# Patient Record
Sex: Female | Born: 1950
Health system: Southern US, Community
[De-identification: ages and names within clinical notes are randomized; demographics above are authoritative.]

## PROBLEM LIST (undated history)

## (undated) DIAGNOSIS — E119 Type 2 diabetes mellitus without complications: Secondary | ICD-10-CM

## (undated) DIAGNOSIS — E7800 Pure hypercholesterolemia, unspecified: Secondary | ICD-10-CM

## (undated) DIAGNOSIS — J209 Acute bronchitis, unspecified: Secondary | ICD-10-CM

## (undated) DIAGNOSIS — E663 Overweight: Secondary | ICD-10-CM

## (undated) DIAGNOSIS — M1990 Unspecified osteoarthritis, unspecified site: Secondary | ICD-10-CM

## (undated) DIAGNOSIS — T7840XA Allergy, unspecified, initial encounter: Secondary | ICD-10-CM

## (undated) DIAGNOSIS — K760 Fatty (change of) liver, not elsewhere classified: Secondary | ICD-10-CM

## (undated) DIAGNOSIS — M545 Low back pain, unspecified: Secondary | ICD-10-CM

## (undated) DIAGNOSIS — F329 Major depressive disorder, single episode, unspecified: Secondary | ICD-10-CM

## (undated) DIAGNOSIS — F32A Depression, unspecified: Secondary | ICD-10-CM

## (undated) DIAGNOSIS — I10 Essential (primary) hypertension: Secondary | ICD-10-CM

## (undated) DIAGNOSIS — R040 Epistaxis: Secondary | ICD-10-CM

## (undated) HISTORY — DX: Type 2 diabetes mellitus without complications: E11.9

## (undated) HISTORY — DX: Acute bronchitis, unspecified: J20.9

## (undated) HISTORY — DX: Pure hypercholesterolemia, unspecified: E78.00

## (undated) HISTORY — DX: Overweight: E66.3

## (undated) HISTORY — DX: Major depressive disorder, single episode, unspecified: F32.9

## (undated) HISTORY — DX: Fatty (change of) liver, not elsewhere classified: K76.0

## (undated) HISTORY — DX: Depression, unspecified: F32.A

## (undated) HISTORY — DX: Low back pain, unspecified: M54.50

## (undated) HISTORY — DX: Low back pain: M54.5

## (undated) HISTORY — DX: Unspecified osteoarthritis, unspecified site: M19.90

## (undated) HISTORY — DX: Epistaxis: R04.0

## (undated) HISTORY — DX: Essential (primary) hypertension: I10

## (undated) HISTORY — DX: Allergy, unspecified, initial encounter: T78.40XA

---

## 1998-12-28 HISTORY — PX: VESICOVAGINAL FISTULA CLOSURE W/ TAH: SUR271

## 1999-12-29 HISTORY — PX: INCISIONAL HERNIA REPAIR: SHX193

## 2004-11-26 ENCOUNTER — Ambulatory Visit: Payer: Self-pay | Admitting: Pulmonary Disease

## 2004-11-27 ENCOUNTER — Ambulatory Visit: Payer: Self-pay | Admitting: Pulmonary Disease

## 2004-12-04 ENCOUNTER — Ambulatory Visit: Payer: Self-pay | Admitting: Gastroenterology

## 2006-01-18 ENCOUNTER — Ambulatory Visit: Payer: Self-pay | Admitting: Pulmonary Disease

## 2006-02-23 ENCOUNTER — Ambulatory Visit: Payer: Self-pay | Admitting: Pulmonary Disease

## 2006-03-08 ENCOUNTER — Ambulatory Visit: Payer: Self-pay | Admitting: Pulmonary Disease

## 2007-02-01 ENCOUNTER — Ambulatory Visit: Payer: Self-pay | Admitting: Pulmonary Disease

## 2008-05-01 DIAGNOSIS — M545 Low back pain: Secondary | ICD-10-CM

## 2008-05-01 DIAGNOSIS — T7840XA Allergy, unspecified, initial encounter: Secondary | ICD-10-CM | POA: Insufficient documentation

## 2008-05-01 DIAGNOSIS — J45909 Unspecified asthma, uncomplicated: Secondary | ICD-10-CM | POA: Insufficient documentation

## 2008-05-01 DIAGNOSIS — I10 Essential (primary) hypertension: Secondary | ICD-10-CM | POA: Insufficient documentation

## 2008-05-02 ENCOUNTER — Ambulatory Visit: Payer: Self-pay | Admitting: Pulmonary Disease

## 2008-05-02 DIAGNOSIS — E663 Overweight: Secondary | ICD-10-CM | POA: Insufficient documentation

## 2008-05-02 DIAGNOSIS — K7689 Other specified diseases of liver: Secondary | ICD-10-CM | POA: Insufficient documentation

## 2008-05-02 DIAGNOSIS — K8020 Calculus of gallbladder without cholecystitis without obstruction: Secondary | ICD-10-CM | POA: Insufficient documentation

## 2008-05-02 DIAGNOSIS — E7800 Pure hypercholesterolemia, unspecified: Secondary | ICD-10-CM

## 2009-06-06 ENCOUNTER — Ambulatory Visit: Payer: Self-pay | Admitting: Diagnostic Radiology

## 2009-06-06 ENCOUNTER — Ambulatory Visit (HOSPITAL_BASED_OUTPATIENT_CLINIC_OR_DEPARTMENT_OTHER): Admission: RE | Admit: 2009-06-06 | Discharge: 2009-06-06 | Payer: Self-pay | Admitting: Pulmonary Disease

## 2009-06-06 ENCOUNTER — Ambulatory Visit: Payer: Self-pay | Admitting: Pulmonary Disease

## 2009-12-05 ENCOUNTER — Ambulatory Visit: Payer: Self-pay | Admitting: Pulmonary Disease

## 2010-11-26 ENCOUNTER — Ambulatory Visit: Payer: Self-pay | Admitting: Pulmonary Disease
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
## 2011-06-01 ENCOUNTER — Other Ambulatory Visit: Payer: Self-pay | Admitting: *Deleted

## 2011-06-01 MED ORDER — HYDROCOD POLST-CHLORPHEN POLST 10-8 MG/5ML PO LQCR: 5.0000 mL | Freq: Two times a day (BID) | ORAL | Status: DC

## 2011-06-01 NOTE — Telephone Encounter (Signed)
Refill of tussionex faxed back to the pharamcy.  Pt will need ov for further refills.

## 2011-06-11 ENCOUNTER — Other Ambulatory Visit: Payer: Self-pay | Admitting: Pulmonary Disease

## 2011-10-19 ENCOUNTER — Encounter: Payer: Self-pay | Admitting: Pulmonary Disease

## 2011-10-20 ENCOUNTER — Encounter: Payer: Self-pay | Admitting: Pulmonary Disease

## 2011-10-20 ENCOUNTER — Telehealth: Payer: Self-pay | Admitting: Pulmonary Disease

## 2011-10-20 ENCOUNTER — Ambulatory Visit (INDEPENDENT_AMBULATORY_CARE_PROVIDER_SITE_OTHER): Payer: Managed Care, Other (non HMO) | Admitting: Pulmonary Disease

## 2011-10-20 VITALS — BP 148/80 | HR 92 | Temp 98.1°F | Ht 62.0 in | Wt 211.8 lb

## 2011-10-20 DIAGNOSIS — Z0000 Encounter for general adult medical examination without abnormal findings: Secondary | ICD-10-CM

## 2011-10-20 DIAGNOSIS — M1990 Unspecified osteoarthritis, unspecified site: Secondary | ICD-10-CM | POA: Insufficient documentation

## 2011-10-20 DIAGNOSIS — I10 Essential (primary) hypertension: Secondary | ICD-10-CM

## 2011-10-20 DIAGNOSIS — E663 Overweight: Secondary | ICD-10-CM

## 2011-10-20 DIAGNOSIS — M2550 Pain in unspecified joint: Secondary | ICD-10-CM

## 2011-10-20 DIAGNOSIS — E7800 Pure hypercholesterolemia, unspecified: Secondary | ICD-10-CM

## 2011-10-20 DIAGNOSIS — M545 Low back pain: Secondary | ICD-10-CM

## 2011-10-20 MED ORDER — HYDROCOD POLST-CHLORPHEN POLST 10-8 MG/5ML PO LQCR: 5.0000 mL | Freq: Two times a day (BID) | ORAL | Status: DC

## 2011-10-20 MED ORDER — VALSARTAN-HYDROCHLOROTHIAZIDE 160-12.5 MG PO TABS: 1.0000 | ORAL_TABLET | Freq: Every day | ORAL | Status: DC

## 2011-10-20 MED ORDER — POTASSIUM CHLORIDE 10 MEQ PO CPCR: 20.0000 meq | ORAL_CAPSULE | Freq: Every day | ORAL | Status: DC

## 2011-10-20 MED ORDER — AMLODIPINE BESYLATE 5 MG PO TABS: 5.0000 mg | ORAL_TABLET | Freq: Every day | ORAL | Status: DC

## 2011-10-20 MED ORDER — FLUTICASONE-SALMETEROL 250-50 MCG/DOSE IN AEPB: 1.0000 | INHALATION_SPRAY | Freq: Two times a day (BID) | RESPIRATORY_TRACT | Status: DC
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
## 2011-10-20 NOTE — Patient Instructions (Signed)
Today we updated your med list in our EPIC system...    We refilled your meds per request...  Please return to our lab in the AM for your follow up fasting blood work...    Please call the [facility] in a few days for your results...    Dial [id number] & when prompted enter your patient number followed by the # symbol...    Your patient number is:  [id number]#  Let's get on track w/ our diet & exercise program...    The goal is to lose 10-15 lbs...  We will inquire about an excellent Orthopedist in [facility] for you to see...  Call for any questions.Marland KitchenMarland Kitchen

## 2011-10-20 NOTE — Telephone Encounter (Signed)
Called and spoke with pt---she stated that she will need a letter stating that we will be doing her chol screening.  We will not be able to put any numbers into this letter since it is being sent to her employer.  She will drop this off in the morning along with the fax number.

## 2011-10-21 ENCOUNTER — Other Ambulatory Visit (INDEPENDENT_AMBULATORY_CARE_PROVIDER_SITE_OTHER): Payer: Managed Care, Other (non HMO)

## 2011-10-21 ENCOUNTER — Encounter: Payer: Self-pay | Admitting: *Deleted

## 2011-10-21 DIAGNOSIS — M1990 Unspecified osteoarthritis, unspecified site: Secondary | ICD-10-CM

## 2011-10-21 DIAGNOSIS — Z0000 Encounter for general adult medical examination without abnormal findings: Secondary | ICD-10-CM

## 2011-10-21 LAB — CBC WITH DIFFERENTIAL/PLATELET
Basophils Absolute: 0 10*3/uL (ref 0.0–0.1)
Eosinophils Relative: 2.5 % (ref 0.0–5.0)
Lymphocytes Relative: 31.1 % (ref 12.0–46.0)
Lymphs Abs: 3 10*3/uL (ref 0.7–4.0)
Monocytes Relative: 5.7 % (ref 3.0–12.0)
Neutrophils Relative %: 60.3 % (ref 43.0–77.0)
Platelets: 246 10*3/uL (ref 150.0–400.0)
RDW: 13.9 % (ref 11.5–14.6)
WBC: 9.5 10*3/uL (ref 4.5–10.5)

## 2011-10-21 LAB — BASIC METABOLIC PANEL WITH GFR
BUN: 17 mg/dL (ref 6–23)
CO2: 26 meq/L (ref 19–32)
Calcium: 9.2 mg/dL (ref 8.4–10.5)
Chloride: 106 meq/L (ref 96–112)
Creatinine, Ser: 0.8 mg/dL (ref 0.4–1.2)
GFR: 82.62 mL/min
Glucose, Bld: 108 mg/dL — ABNORMAL HIGH (ref 70–99)
Potassium: 3.5 meq/L (ref 3.5–5.1)
Sodium: 142 meq/L (ref 135–145)

## 2011-10-21 LAB — LIPID PANEL
HDL: 52.5 mg/dL (ref >39.00)
Triglycerides: 92 mg/dL (ref 0.0–149.0)

## 2011-10-21 LAB — HEPATIC FUNCTION PANEL
ALT: 36 U/L — ABNORMAL HIGH (ref 0–35)
AST: 27 U/L (ref 0–37)
Albumin: 4.2 g/dL (ref 3.5–5.2)
Alkaline Phosphatase: 53 U/L (ref 39–117)
Total Protein: 8.1 g/dL (ref 6.0–8.3)

## 2011-10-21 LAB — TSH: TSH: 2.94 u[IU]/mL (ref 0.35–5.50)

## 2011-10-22 LAB — RHEUMATOID FACTOR: Rhuematoid fact SerPl-aCnc: <10 IU/mL (ref <=14)

## 2011-10-22 LAB — ANA: Anti Nuclear Antibody(ANA): NEGATIVE

## 2011-10-27 ENCOUNTER — Encounter: Payer: Self-pay | Admitting: Pulmonary Disease

## 2011-10-27 NOTE — Telephone Encounter (Signed)
lmom advising letter has been faxed and labs and other forms have been placed in the mail back to the pt.

## 2011-10-27 NOTE — Telephone Encounter (Signed)
Letter has been done and faxed to [employer] at [phone].  Labs and other forms have been placed in the mail back to the patient.

## 2011-10-27 NOTE — Telephone Encounter (Signed)
Pt is checking on status of letter.  Pt can be reached before 5:00 at [phone] and after 5:00 at [phone].  Anne Christensen

## 2012-02-08 ENCOUNTER — Telehealth: Payer: Self-pay | Admitting: Pulmonary Disease

## 2012-02-08 NOTE — Telephone Encounter (Signed)
Called and spoke with pt.  Pt states she just recently started the Valsartan- HCTZ a few weeks ago (states she wanted to finish up her old med first)  Pt states since starting on Valsartan, she has been having "awful headaches" every afternoon and "feels hot" often.  Pt states she hasn't had her BP checked recently.  Pt states she did [facility] and was told she needs stronger reading glasses and pt states today she noticed her vision is a little blurry.  Pt is requesting SN's recs.  Please advise.  Thanks! Allergies  Allergen Reactions  . Penicillins     Swelling, rash, hives all over

## 2012-02-09 NOTE — Telephone Encounter (Signed)
Per SN----this must have been when insurance changed to generic diovan/hct.     We can either  1.  Change back to diovan/hct  160/12.5 daily 2. Change to losartan/hct  100/12.5 daily--this is a different med.    lmomtcb for pt to find out which med she wants to go back on.

## 2012-02-09 NOTE — Telephone Encounter (Signed)
Pt calling again in ref to previous message can be reached at [phone].Anne Christensen

## 2012-02-10 MED ORDER — DIOVAN HCT 160-12.5 MG PO TABS: 1.0000 | ORAL_TABLET | Freq: Every day | ORAL | Status: DC

## 2012-02-10 NOTE — Telephone Encounter (Signed)
I spoke with pt and is requesting anrx for DAW rx for her diovan hct sent to the pharmacy since she feels like that helps her. I have sent this rx and nothing further was needed

## 2012-05-24 ENCOUNTER — Telehealth: Payer: Self-pay | Admitting: Pulmonary Disease

## 2012-05-24 MED ORDER — LOSARTAN POTASSIUM-HCTZ 100-12.5 MG PO TABS: 1.0000 | ORAL_TABLET | Freq: Every day | ORAL | Status: DC

## 2012-05-24 NOTE — Telephone Encounter (Signed)
Called, spoke with pt who states diovan hct will now cost $237.  This has increased from the last time she had this filled.  She has spoken with her employer in HR regarding this and was advised it was probably because there is a generic for diovan hct now.  However, pt has tried valsartan hctz in the past and experienced HA while taking it.  Dr. Kriste Basque, pls advise.  Thank you.  Note:  I did call [facility] on [address] and spoke with Kathlene November who did advise no PA is available for the diovan hct.

## 2012-05-24 NOTE — Telephone Encounter (Signed)
Per SN---ok to change to losartan /hctz  100.12.5  1 daily.  Called and spoke with pt and she is aware that this med has been sent to her pharmacy for 90 day supply.  Nothing further needed.

## 2012-09-05 ENCOUNTER — Other Ambulatory Visit: Payer: Self-pay | Admitting: Pulmonary Disease

## 2012-10-17 ENCOUNTER — Ambulatory Visit: Payer: Managed Care, Other (non HMO) | Admitting: Pulmonary Disease

## 2012-10-25 ENCOUNTER — Encounter: Payer: Self-pay | Admitting: *Deleted

## 2012-10-26 ENCOUNTER — Ambulatory Visit (INDEPENDENT_AMBULATORY_CARE_PROVIDER_SITE_OTHER): Payer: Managed Care, Other (non HMO) | Admitting: Pulmonary Disease

## 2012-10-26 ENCOUNTER — Encounter: Payer: Self-pay | Admitting: Pulmonary Disease

## 2012-10-26 VITALS — BP 138/82 | HR 116 | Temp 98.4°F | Ht 62.0 in | Wt 218.0 lb

## 2012-10-26 DIAGNOSIS — M1990 Unspecified osteoarthritis, unspecified site: Secondary | ICD-10-CM

## 2012-10-26 DIAGNOSIS — K7689 Other specified diseases of liver: Secondary | ICD-10-CM

## 2012-10-26 DIAGNOSIS — E663 Overweight: Secondary | ICD-10-CM

## 2012-10-26 DIAGNOSIS — I10 Essential (primary) hypertension: Secondary | ICD-10-CM

## 2012-10-26 DIAGNOSIS — M545 Low back pain: Secondary | ICD-10-CM

## 2012-10-26 DIAGNOSIS — K8020 Calculus of gallbladder without cholecystitis without obstruction: Secondary | ICD-10-CM

## 2012-10-26 DIAGNOSIS — E7800 Pure hypercholesterolemia, unspecified: Secondary | ICD-10-CM

## 2012-10-26 MED ORDER — AMLODIPINE BESYLATE 5 MG PO TABS: 5.0000 mg | ORAL_TABLET | Freq: Every day | ORAL | Status: DC

## 2012-10-26 MED ORDER — LOSARTAN POTASSIUM-HCTZ 100-12.5 MG PO TABS: 1.0000 | ORAL_TABLET | Freq: Every day | ORAL | Status: DC

## 2012-10-26 MED ORDER — POTASSIUM CHLORIDE ER 10 MEQ PO CPCR: ORAL_CAPSULE | ORAL | Status: DC
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
## 2012-10-26 NOTE — Patient Instructions (Addendum)
Today we updated your med list in our EPIC system...    Continue your current medications the same...    We refilled your meds per request...  For your sinuses>>    Use a non-medicated, plain SALINE nasal mist & spray your nose every 1-2 H as needed...    If you have recurrent epistaxis - we should refer you to an ENT specialist for further Rx...  REMEMBER:  You need to get a baseline screening colonoscopy!!!  Please set this up at your convenience w/ DrStark in our GI Dept or my nurse would be happy to assist you w/ these arrangements...  Let's work on weight reduction by getting on a low carb low fat diet & increasing our exercise program...  Call for any questions or concerns...  Let's continue our yearly follow up visits but call anytime as needed for any problems.Marland KitchenMarland Kitchen

## 2012-12-05 ENCOUNTER — Other Ambulatory Visit: Payer: Self-pay | Admitting: Pulmonary Disease

## 2013-05-02 ENCOUNTER — Other Ambulatory Visit: Payer: Self-pay | Admitting: Pulmonary Disease

## 2013-07-18 ENCOUNTER — Ambulatory Visit (INDEPENDENT_AMBULATORY_CARE_PROVIDER_SITE_OTHER): Payer: BC Managed Care – PPO | Admitting: Pulmonary Disease

## 2013-07-18 ENCOUNTER — Other Ambulatory Visit (INDEPENDENT_AMBULATORY_CARE_PROVIDER_SITE_OTHER): Payer: BC Managed Care – PPO

## 2013-07-18 ENCOUNTER — Encounter: Payer: Self-pay | Admitting: Pulmonary Disease

## 2013-07-18 ENCOUNTER — Ambulatory Visit (INDEPENDENT_AMBULATORY_CARE_PROVIDER_SITE_OTHER)
Admission: RE | Admit: 2013-07-18 | Discharge: 2013-07-18 | Disposition: A | Discharge status: Home / Self Care | Payer: BC Managed Care – PPO | Source: Ambulatory Visit | Attending: Pulmonary Disease | Admitting: Pulmonary Disease

## 2013-07-18 VITALS — BP 136/84 | HR 100 | Temp 99.2°F | Ht 62.0 in | Wt 220.0 lb

## 2013-07-18 DIAGNOSIS — Z0000 Encounter for general adult medical examination without abnormal findings: Secondary | ICD-10-CM

## 2013-07-18 DIAGNOSIS — I10 Essential (primary) hypertension: Secondary | ICD-10-CM

## 2013-07-18 DIAGNOSIS — R040 Epistaxis: Secondary | ICD-10-CM

## 2013-07-18 DIAGNOSIS — K7689 Other specified diseases of liver: Secondary | ICD-10-CM

## 2013-07-18 DIAGNOSIS — E7800 Pure hypercholesterolemia, unspecified: Secondary | ICD-10-CM

## 2013-07-18 DIAGNOSIS — M545 Low back pain: Secondary | ICD-10-CM

## 2013-07-18 DIAGNOSIS — J45909 Unspecified asthma, uncomplicated: Secondary | ICD-10-CM

## 2013-07-18 DIAGNOSIS — M1990 Unspecified osteoarthritis, unspecified site: Secondary | ICD-10-CM

## 2013-07-18 DIAGNOSIS — E663 Overweight: Secondary | ICD-10-CM

## 2013-07-18 LAB — CBC WITH DIFFERENTIAL/PLATELET
Basophils Absolute: 0 10*3/uL (ref 0.0–0.1)
Eosinophils Absolute: 0.2 10*3/uL (ref 0.0–0.7)
Hemoglobin: 15.3 g/dL — ABNORMAL HIGH (ref 12.0–15.0)
Lymphocytes Relative: 25 % (ref 12.0–46.0)
MCHC: 33.9 g/dL (ref 30.0–36.0)
Monocytes Absolute: 0.6 10*3/uL (ref 0.1–1.0)
Neutro Abs: 8.9 10*3/uL — ABNORMAL HIGH (ref 1.4–7.7)
Neutrophils Relative %: 68.5 % (ref 43.0–77.0)
RDW: 13.8 % (ref 11.5–14.6)

## 2013-07-18 LAB — BASIC METABOLIC PANEL
CO2: 26 mEq/L (ref 19–32)
Chloride: 102 mEq/L (ref 96–112)
Sodium: 139 mEq/L (ref 135–145)

## 2013-07-18 LAB — LDL CHOLESTEROL, DIRECT: Direct LDL: 149 mg/dL

## 2013-07-18 LAB — HEPATIC FUNCTION PANEL
ALT: 67 U/L — ABNORMAL HIGH (ref 0–35)
Alkaline Phosphatase: 55 U/L (ref 39–117)
Bilirubin, Direct: 0.1 mg/dL (ref 0.0–0.3)
Total Protein: 8.3 g/dL (ref 6.0–8.3)

## 2013-07-18 LAB — LIPID PANEL: Total CHOL/HDL Ratio: 5

## 2013-07-18 MED ORDER — LOSARTAN POTASSIUM-HCTZ 100-12.5 MG PO TABS: 1.0000 | ORAL_TABLET | Freq: Every day | ORAL | Status: DC

## 2013-07-18 MED ORDER — AMLODIPINE BESYLATE 5 MG PO TABS: 5.0000 mg | ORAL_TABLET | Freq: Every day | ORAL | Status: DC

## 2013-07-18 MED ORDER — POTASSIUM CHLORIDE ER 10 MEQ PO CPCR: ORAL_CAPSULE | ORAL | Status: DC

## 2013-07-18 MED ORDER — HYDROCOD POLST-CHLORPHEN POLST 10-8 MG/5ML PO LQCR: 5.0000 mL | Freq: Two times a day (BID) | ORAL | Status: DC

## 2013-07-18 NOTE — Patient Instructions (Addendum)
Today we updated your med list in our EPIC system...    Continue your current medications the same...    We refilled your meds per request...  Today we did your follow up CXR & FASTING blood work...    We will contact you w/ the results when available...     We will complete your work HealthForm & fax it to [employer]...  Let's get on track w/ our diet & exercise program...  Call for any questions...  Let's plan a follow up visit in 1yr, sooner if needed for problems.Marland KitchenMarland Kitchen
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
## 2013-07-20 ENCOUNTER — Other Ambulatory Visit: Payer: Self-pay | Admitting: Pulmonary Disease

## 2013-07-20 DIAGNOSIS — E7800 Pure hypercholesterolemia, unspecified: Secondary | ICD-10-CM

## 2013-07-20 MED ORDER — POTASSIUM CHLORIDE ER 10 MEQ PO CPCR: ORAL_CAPSULE | ORAL | Status: DC

## 2013-07-20 MED ORDER — SIMVASTATIN 20 MG PO TABS: 20.0000 mg | ORAL_TABLET | Freq: Every evening | ORAL | Status: DC

## 2013-07-25 ENCOUNTER — Telehealth: Payer: Self-pay | Admitting: Pulmonary Disease

## 2013-07-25 NOTE — Telephone Encounter (Signed)
Leigh did you mail an Rx to this pt for potassium? And please advise if paperwork has been completed for her employer? Was this sent to [employer]? Please advise. Carron Curie, CMA

## 2013-07-26 MED ORDER — POTASSIUM CHLORIDE ER 10 MEQ PO CPCR: ORAL_CAPSULE | ORAL | Status: DC

## 2013-07-26 NOTE — Telephone Encounter (Signed)
Patient calling back about the same.  Work # [phone]

## 2013-07-26 NOTE — Telephone Encounter (Signed)
rx has been printed out again and will place in the mail to the pt again.  Form has been filled out by SN and i have called and spoke with pt and she is aware that this has been faxed back to [employer].

## 2013-07-28 ENCOUNTER — Telehealth: Payer: Self-pay | Admitting: Pulmonary Disease

## 2013-07-28 NOTE — Telephone Encounter (Signed)
Called and spoke with pt and she is aware that this form has been faxed to her and to the company.  Nothing further is needed.

## 2013-09-26 ENCOUNTER — Encounter: Payer: Self-pay | Admitting: Gastroenterology

## 2013-11-02 ENCOUNTER — Other Ambulatory Visit: Payer: Self-pay

## 2014-03-06 ENCOUNTER — Encounter: Payer: Self-pay | Admitting: Pulmonary Disease

## 2014-06-14 ENCOUNTER — Other Ambulatory Visit: Payer: Self-pay | Admitting: Pulmonary Disease

## 2014-07-17 ENCOUNTER — Telehealth: Payer: Self-pay | Admitting: Pulmonary Disease

## 2014-07-17 MED ORDER — POTASSIUM CHLORIDE ER 10 MEQ PO CPCR: ORAL_CAPSULE | ORAL | Status: DC

## 2014-07-17 NOTE — Telephone Encounter (Signed)
[id number] x1 

## 2014-07-17 NOTE — Telephone Encounter (Signed)
I called spoke with pt. She wanted to know who SN recommended for PCP. I advised her any of the [facility] offices. She did not need a referral. She will call for her own appt per pt. She did need new RX [facility] potassium [facility]. i have done so. Nothing further needed

## 2014-09-14 ENCOUNTER — Other Ambulatory Visit: Payer: Self-pay | Admitting: Pulmonary Disease

## 2014-10-05 ENCOUNTER — Ambulatory Visit: Payer: BC Managed Care – PPO | Admitting: Physician Assistant

## 2016-03-13 ENCOUNTER — Telehealth: Payer: Self-pay | Admitting: *Deleted

## 2016-03-13 NOTE — Telephone Encounter (Signed)
Unable to reach patient at time of pre-visit call. Left message for patient to return call when available.  

## 2016-03-16 ENCOUNTER — Ambulatory Visit (INDEPENDENT_AMBULATORY_CARE_PROVIDER_SITE_OTHER): Payer: BLUE CROSS/BLUE SHIELD | Admitting: Family Medicine

## 2016-03-16 ENCOUNTER — Encounter: Payer: Self-pay | Admitting: Family Medicine

## 2016-03-16 VITALS — BP 150/80 | HR 99 | Temp 98.7°F | Ht 62.0 in | Wt 191.6 lb

## 2016-03-16 DIAGNOSIS — E119 Type 2 diabetes mellitus without complications: Secondary | ICD-10-CM | POA: Diagnosis not present

## 2016-03-16 DIAGNOSIS — M542 Cervicalgia: Secondary | ICD-10-CM

## 2016-03-16 DIAGNOSIS — I10 Essential (primary) hypertension: Secondary | ICD-10-CM | POA: Diagnosis not present

## 2016-03-16 HISTORY — DX: Type 2 diabetes mellitus without complications: E11.9

## 2016-03-16 MED ORDER — AMLODIPINE BESYLATE 10 MG PO TABS: 10.0000 mg | ORAL_TABLET | Freq: Every day | ORAL | Status: DC

## 2016-03-16 MED ORDER — CYCLOBENZAPRINE HCL 10 MG PO TABS: 10.0000 mg | ORAL_TABLET | Freq: Every day | ORAL | Status: DC

## 2016-03-16 NOTE — Progress Notes (Signed)
[facility] [address], [address] [place], [place] [address] [phone]  Date:  03/16/2016   Name:  Anne Christensen   DOB:  11-03-1951   MRN:  [id number]  PCP:  Abbe Amsterdam, MD    Chief Complaint: Establish Care   History of Present Illness:  Anne Christensen is a 65 y.o. very pleasant female patient who presents with the following:  Here today as a new pt to establish care History of HTN, overweight/ fatty liver, DMII  She needs a new PCP and would like to see me  About one month ago she was leaning back to look up at some trees and had a sudden pain in her neck.  "like something grabbed."  It is a bit better, but she still has pain in the left side of her head and in her neck.  She has had this same sort of issue with her neck in the past but not in many years.  She is not aware of any injury to her neck  She has tried tylenol and massage and gentle ROM She does feel like it is still improving but not yet well No sx down her arms- no numbness, tingling, weakness   She was dx with diabetes last year- she takes metformin and trulicity.  She has responded well to this regimen per her endocrinologist Dr. Dreama Saa with [facility].   She has lost about 30 lbs though her medications and exercise.  She is very pleased and so is Dr. Dreama Saa!   She notes that her BP tends to run high when she is at MD office- at home she generally gets numbers more around 130/90s.  However she is concerned that her BP is not well controlled and also is afraid she will not get a good reading at her work health screenings   Patient Active Problem List   Diagnosis Date Noted  . Epistaxis, recurrent 07/18/2013  . DJD (degenerative joint disease) 10/20/2011  . HYPERCHOLESTEROLEMIA 05/02/2008  . OVERWEIGHT 05/02/2008  . FATTY LIVER DISEASE 05/02/2008  . CHOLELITHIASIS 05/02/2008  . HYPERTENSION 05/01/2008  . ASTHMA 05/01/2008  . BACK PAIN, LUMBAR  05/01/2008  . ALLERGY 05/01/2008    Past Medical History  Diagnosis Date  . Allergy, unspecified not elsewhere classified   . Acute bronchitis   . Asthma   . Hypertension   . Overweight(278.02)   . Hypercholesterolemia   . Cholelithiasis   . Fatty liver   . Lumbar back pain   . Bleeding nose   . Depression   . Arthritis     Past Surgical History  Procedure Laterality Date  . Vesicovaginal fistula closure w/ tah  2000  . Incisional hernia repair  2001    Social History  Substance Use Topics  . Smoking status: Never Smoker   . Smokeless tobacco: None  . Alcohol Use: No    Family History  Problem Relation Age of Onset  . Asthma      Allergies  Allergen Reactions  . Penicillins     Swelling, rash, hives all over    Medication list has been reviewed and updated.  Current Outpatient Prescriptions on File Prior to Visit  Medication Sig Dispense Refill  . amLODipine (NORVASC) 5 MG tablet TAKE 1 TABLET DAILY 90 tablet 1  . losartan-hydrochlorothiazide (HYZAAR) 100-12.5 MG per tablet TAKE 1 TABLET DAILY 90 tablet 1   No current facility-administered medications on file prior to visit.  Review of Systems:  As per HPI- otherwise negative.   Physical Examination: Filed Vitals:   03/16/16 1522  BP: 152/100  Pulse: 99  Temp: 98.7 F (37.1 C)   Filed Vitals:   03/16/16 1522  Height: 5\' 2"  (1.575 m)  Weight: 191 lb 9.6 oz (86.909 kg)   Body mass index is 35.04 kg/(m^2). Ideal Body Weight: Weight in (lb) to have BMI = 25: 136.4  GEN: WDWN, NAD, Non-toxic, A & O x 3, overweight, looks well HEENT: Atraumatic, Normocephalic. Neck supple. No masses, No LAD.  Bilateral TM wnl, oropharynx normal.  PEERL,EOMI.   Ears and Nose: No external deformity. CV: RRR, No M/G/R. No JVD. No thrill. No extra heart sounds. PULM: CTA B, no wheezes, crackles, rhonchi. No retractions. No resp. distress. No accessory muscle use. EXTR: No c/c/e NEURO Normal gait.  PSYCH:  Normally interactive. Conversant. Not depressed or anxious appearing.  Calm demeanor.  She has tenderness in the left sided muscles of her neck.  Normal neck ROM except she is a bit tight rotating to the right Normal strength of both UE  No lesions, no swelling  Assessment and Plan: Controlled type 2 diabetes mellitus without complication, without long-term current use of insulin (HCC)  Neck pain - Plan: cyclobenzaprine (FLEXERIL) 10 MG tablet, DG Cervical Spine Complete  Essential hypertension - Plan: amLODipine (NORVASC) 10 MG tablet  Here today with neck pain - suspect she has OA. Will obtain neck films (she will come back for this tomorrow) and will have her try some flexeril as needed for the pain and stiffness- cautioned regarding sedation Will increase her norvasc to 10 mg for incompletely controlled HTN Plan further follow-up pending her neck films and her response to our treatment plan     Signed Abbe AmsterdamJessica Keyani Rigdon, MD

## 2016-03-16 NOTE — Patient Instructions (Signed)
We will increase your amlodipine to 10 mg a day- let me know if you notice any problems with this change Come back tomorrow to have x-rays of your neck and I will be in touch with your results asap  Try taking a flexeril before bed (a 1/2 tablet may be enough!) for your neck pain and stiffness

## 2016-03-16 NOTE — Progress Notes (Signed)
Pre visit review using our clinic review tool, if applicable. No additional management support is needed unless otherwise documented below in the visit note. 

## 2016-03-17 ENCOUNTER — Ambulatory Visit (HOSPITAL_BASED_OUTPATIENT_CLINIC_OR_DEPARTMENT_OTHER)
Admission: RE | Admit: 2016-03-17 | Discharge: 2016-03-17 | Disposition: A | Discharge status: Home / Self Care | Payer: BLUE CROSS/BLUE SHIELD | Source: Ambulatory Visit | Attending: Family Medicine | Admitting: Family Medicine

## 2016-03-17 DIAGNOSIS — M542 Cervicalgia: Secondary | ICD-10-CM | POA: Diagnosis present

## 2016-03-20 ENCOUNTER — Encounter: Payer: Self-pay | Admitting: Family Medicine

## 2016-04-22 ENCOUNTER — Ambulatory Visit: Payer: BLUE CROSS/BLUE SHIELD | Admitting: Family Medicine

## 2016-04-23 ENCOUNTER — Ambulatory Visit: Payer: BLUE CROSS/BLUE SHIELD | Admitting: Family Medicine

## 2016-04-27 ENCOUNTER — Encounter: Payer: Self-pay | Admitting: Family Medicine

## 2016-04-27 ENCOUNTER — Ambulatory Visit (INDEPENDENT_AMBULATORY_CARE_PROVIDER_SITE_OTHER): Payer: BLUE CROSS/BLUE SHIELD | Admitting: Family Medicine

## 2016-04-27 VITALS — BP 142/90 | HR 97 | Temp 98.6°F | Ht 62.5 in | Wt 192.4 lb

## 2016-04-27 DIAGNOSIS — I10 Essential (primary) hypertension: Secondary | ICD-10-CM

## 2016-04-27 DIAGNOSIS — S161XXD Strain of muscle, fascia and tendon at neck level, subsequent encounter: Secondary | ICD-10-CM | POA: Diagnosis not present

## 2016-04-27 DIAGNOSIS — R600 Localized edema: Secondary | ICD-10-CM | POA: Diagnosis not present

## 2016-04-27 MED ORDER — AMLODIPINE BESYLATE 5 MG PO TABS: 5.0000 mg | ORAL_TABLET | Freq: Every day | ORAL | Status: DC

## 2016-04-27 MED ORDER — LOSARTAN POTASSIUM-HCTZ 100-25 MG PO TABS: 1.0000 | ORAL_TABLET | Freq: Every day | ORAL | Status: DC
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
## 2016-04-27 NOTE — Progress Notes (Signed)
Pre visit review using our clinic tool,if applicable. No additional management support is needed unless otherwise documented below in the visit note.  

## 2016-04-27 NOTE — Progress Notes (Signed)
Pre visit review using our clinic review tool, if applicable. No additional management support is needed unless otherwise documented below in the visit note. 

## 2016-04-27 NOTE — Patient Instructions (Addendum)
Cut back to 5mg  of the amlodipine to try and improve your swelling.   I will give you a note to get a stand up desk.   Also, please try some compression socks- I think that you will like these! The Go2 socks (you can order on [employer]) may be helpful for your swelling  Discuss insurance coverage with your chiropractor.  If you decide to do PT instead let me know and I will arrange this for you!  I do think that physical therapy, massage or chiropractic care may be helpful for you

## 2016-07-15 ENCOUNTER — Encounter: Payer: Self-pay | Admitting: Family Medicine

## 2016-07-15 ENCOUNTER — Ambulatory Visit (INDEPENDENT_AMBULATORY_CARE_PROVIDER_SITE_OTHER): Payer: BLUE CROSS/BLUE SHIELD | Admitting: Family Medicine

## 2016-07-15 VITALS — BP 142/82 | HR 92 | Temp 98.4°F | Ht 63.0 in | Wt 188.2 lb

## 2016-07-15 DIAGNOSIS — E119 Type 2 diabetes mellitus without complications: Secondary | ICD-10-CM

## 2016-07-15 DIAGNOSIS — I10 Essential (primary) hypertension: Secondary | ICD-10-CM

## 2016-07-15 DIAGNOSIS — M7701 Medial epicondylitis, right elbow: Secondary | ICD-10-CM

## 2016-07-15 MED ORDER — MELOXICAM 7.5 MG PO TABS: 7.5000 mg | ORAL_TABLET | Freq: Every day | ORAL | Status: DC

## 2016-07-15 NOTE — Progress Notes (Signed)
[facility] [address], [address] [place], [place] [id number] [phone] [phone]  Date:  07/15/2016   Name:  Anne IllLinda Wehrly   DOB:  [birth year]   MRN:  [id number]  PCP:  Abbe AmsterdamOPLAND,JESSICA, MD    Chief Complaint: Elbow Pain   History of Present Illness:  Anne Christensen is a 65 y.o. very pleasant female patient who presents with the following:  She is here today with a right elbow pain. It has been present for about one month.  She did have tendonitis in the 1980s, but since then her elbow has been ok.  She is not aware of any injury in particular.   Her tenderness is along the medial epicondyle, the lateral elbow is ok She has tried advil, and an OTC roll- on medication. This does help her some She has not done any new activities that she can recall  We did decrease her norvasc at her last visit from 10 to 5 for ankle edema; her ankles are doing much better Her BP was 145/80 with her DM doc this week She reports that her most recent A1c was in the 5s- she would like for me to take over her DM checks which is fine. Her endocrinologist is moving away  BP Readings from Last 3 Encounters:  07/15/16 155/81  04/27/16 142/90  03/16/16 150/80     Patient Active Problem List   Diagnosis Date Noted  . Diabetes mellitus type 2, controlled, without complications (HCC) 03/16/2016  . Epistaxis, recurrent 07/18/2013  . DJD (degenerative joint disease) 10/20/2011  . HYPERCHOLESTEROLEMIA 05/02/2008  . OVERWEIGHT 05/02/2008  . FATTY LIVER DISEASE 05/02/2008  . CHOLELITHIASIS 05/02/2008  . HYPERTENSION 05/01/2008  . ASTHMA 05/01/2008  . BACK PAIN, LUMBAR 05/01/2008  . ALLERGY 05/01/2008    Past Medical History  Diagnosis Date  . Allergy, unspecified not elsewhere classified   . Acute bronchitis   . Asthma   . Hypertension   . Overweight(278.02)   . Hypercholesterolemia   . Cholelithiasis   . Fatty liver   . Lumbar back pain   . Bleeding  nose   . Depression   . Arthritis   . Diabetes mellitus type 2, controlled, without complications (HCC) 03/16/2016    Past Surgical History  Procedure Laterality Date  . Vesicovaginal fistula closure w/ tah  2000  . Incisional hernia repair  2001    Social History  Substance Use Topics  . Smoking status: Never Smoker   . Smokeless tobacco: None  . Alcohol Use: No    Family History  Problem Relation Age of Onset  . Asthma      Allergies  Allergen Reactions  . Penicillins     Swelling, rash, hives all over    Medication list has been reviewed and updated.  Current Outpatient Prescriptions on File Prior to Visit  Medication Sig Dispense Refill  . amLODipine (NORVASC) 5 MG tablet Take 1 tablet (5 mg total) by mouth daily. 90 tablet 3  . [START ON 07/17/2016] Dulaglutide (TRULICITY) 1.5 MG/0.5ML SOPN Inject 1.5 mg into the skin once a week.     . losartan-hydrochlorothiazide (HYZAAR) 100-25 MG tablet Take 1 tablet by mouth daily. 90 tablet 3  . metFORMIN (GLUCOPHAGE) 500 MG tablet Take by mouth 2 (two) times daily with a meal. 2 tablets at breakfast and 2 at supper.     No current facility-administered medications on file prior to visit.    Review of  Systems:  As per HPI- otherwise negative.   Physical Examination: Filed Vitals:   07/15/16 1001  BP: 155/81  Pulse: 92  Temp: 98.4 F (36.9 C)    Filed Vitals:   07/15/16 1001  Height:  (1.6 m)  Weight: 188 lb 3.2 oz (85.367 kg)   Body mass index is 33.35 kg/(m^2). Ideal Body Weight: Weight in (lb) to have BMI = 25: 140.8  GEN: WDWN, NAD, Non-toxic, A & O x 3, obese, looks well HEENT: Atraumatic, Normocephalic. Neck supple. No masses, No LAD. Ears and Nose: No external deformity. CV: RRR, No M/G/R. No JVD. No thrill. No extra heart sounds. PULM: CTA B, no wheezes, crackles, rhonchi. No retractions. No resp. distress. No accessory muscle use. EXTR: No c/c/e NEURO Normal gait.  PSYCH: Normally  interactive. Conversant. Not depressed or anxious appearing.  Calm demeanor.  Right elbow: she is tender over the medial epicondyle, not over the lateral.  Normal flexion, pain with full extension but ROM is normal, normal supination and pronation.  Wrist is negative.  No redness, heat or swelling, no wound   Pain with resisted pronation, not with supination Assessment and Plan: Medial epicondylitis of elbow, right - Plan: meloxicam (MOBIC) 7.5 MG tablet  Essential hypertension  Controlled type 2 diabetes mellitus without complication, without long-term current use of insulin (HCC)  Well controlled DM, I can certainly take over her A1c check and DM care BP is reasonable, had to go down on amlodipine for swelling.  Continue current regimen Medial epicondylitis; mobic 7.5 daily for 2 weeks, ice, strapping, stretches.  If not helpful she will let me know and we will refer to ortho or sports med for possible injection  Signed Abbe Amsterdam, MD

## 2016-07-15 NOTE — Progress Notes (Signed)
Pre visit review using our clinic review tool, if applicable. No additional management support is needed unless otherwise documented below in the visit note. 

## 2016-07-15 NOTE — Patient Instructions (Signed)
I think that your elbow pain is caused by medial epicondylitis.   Try a tennis elbow strap, one mobic pill daily, ice, and avoid painful activities.  If this is not helpful after about 2 weeks let me know! Please look over the hand-out I gave you about this injury  Your blood pressure is acceptable.  Losing weight (the trulicity may help) will help us keep your BP under control without adding medication I am glad to see you for your diabetes check when you are due.  Take care and please keep me posted!

## 2016-08-11 ENCOUNTER — Encounter: Payer: Self-pay | Admitting: Family Medicine

## 2016-08-11 ENCOUNTER — Other Ambulatory Visit: Payer: Self-pay | Admitting: Family Medicine

## 2016-08-11 ENCOUNTER — Other Ambulatory Visit: Payer: Self-pay | Admitting: Emergency Medicine

## 2016-08-11 DIAGNOSIS — M7701 Medial epicondylitis, right elbow: Secondary | ICD-10-CM

## 2017-01-03 ENCOUNTER — Encounter: Payer: Self-pay | Admitting: Family Medicine

## 2017-01-04 ENCOUNTER — Other Ambulatory Visit: Payer: Self-pay | Admitting: Emergency Medicine

## 2017-01-04 DIAGNOSIS — I10 Essential (primary) hypertension: Secondary | ICD-10-CM

## 2017-01-04 MED ORDER — AMLODIPINE BESYLATE 5 MG PO TABS: 5.0000 mg | ORAL_TABLET | Freq: Every day | ORAL | 0 refills | Status: DC

## 2017-01-04 MED ORDER — LOSARTAN POTASSIUM-HCTZ 100-25 MG PO TABS: 1.0000 | ORAL_TABLET | Freq: Every day | ORAL | 0 refills | Status: DC

## 2017-03-31 ENCOUNTER — Other Ambulatory Visit: Payer: Self-pay | Admitting: Family Medicine

## 2017-03-31 ENCOUNTER — Ambulatory Visit (HOSPITAL_BASED_OUTPATIENT_CLINIC_OR_DEPARTMENT_OTHER)
Admission: RE | Admit: 2017-03-31 | Discharge: 2017-03-31 | Disposition: A | Discharge status: Home / Self Care | Payer: BLUE CROSS/BLUE SHIELD | Source: Ambulatory Visit | Attending: Family Medicine | Admitting: Family Medicine

## 2017-03-31 ENCOUNTER — Encounter: Payer: Self-pay | Admitting: Family Medicine

## 2017-03-31 ENCOUNTER — Ambulatory Visit (INDEPENDENT_AMBULATORY_CARE_PROVIDER_SITE_OTHER): Payer: BLUE CROSS/BLUE SHIELD | Admitting: Family Medicine

## 2017-03-31 VITALS — BP 154/66 | HR 83 | Temp 98.8°F | Ht 63.0 in | Wt 191.0 lb

## 2017-03-31 DIAGNOSIS — M4804 Spinal stenosis, thoracic region: Secondary | ICD-10-CM | POA: Diagnosis not present

## 2017-03-31 DIAGNOSIS — R600 Localized edema: Secondary | ICD-10-CM

## 2017-03-31 DIAGNOSIS — E876 Hypokalemia: Secondary | ICD-10-CM | POA: Diagnosis not present

## 2017-03-31 DIAGNOSIS — M546 Pain in thoracic spine: Secondary | ICD-10-CM | POA: Diagnosis not present

## 2017-03-31 DIAGNOSIS — I10 Essential (primary) hypertension: Secondary | ICD-10-CM | POA: Diagnosis not present

## 2017-03-31 DIAGNOSIS — E119 Type 2 diabetes mellitus without complications: Secondary | ICD-10-CM

## 2017-03-31 DIAGNOSIS — R3129 Other microscopic hematuria: Secondary | ICD-10-CM

## 2017-03-31 DIAGNOSIS — M47894 Other spondylosis, thoracic region: Secondary | ICD-10-CM | POA: Insufficient documentation

## 2017-03-31 DIAGNOSIS — M79644 Pain in right finger(s): Secondary | ICD-10-CM | POA: Diagnosis not present

## 2017-03-31 LAB — POCT URINALYSIS DIPSTICK
BILIRUBIN UA: NEGATIVE
GLUCOSE UA: NEGATIVE
Ketones, UA: NEGATIVE
Leukocytes, UA: NEGATIVE
NITRITE UA: NEGATIVE
Protein, UA: NEGATIVE
Spec Grav, UA: 1.03 (ref 1.030–1.035)
UROBILINOGEN UA: 4 — AB (ref ?–2.0)
pH, UA: 6 (ref 5.0–8.0)

## 2017-03-31 IMAGING — DX DG CERVICAL SPINE COMPLETE 4+V
6 series · 6 of 6 positions shown · non-contrast
Comparison: None.

CLINICAL DATA: Left side neck pain after leaning back her head
sitting in a chair 4 weeks ago

EXAM:
CERVICAL SPINE - COMPLETE 4+ VIEW

[c-spine lat]
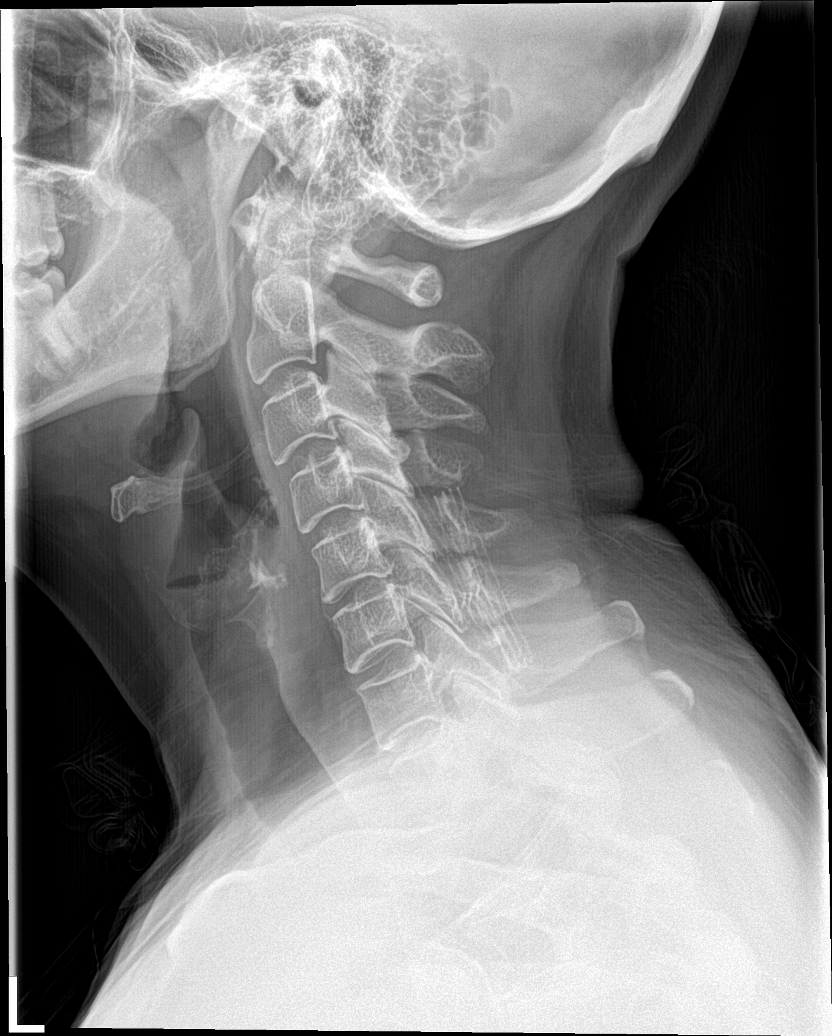

[c-spine obl (1 of 2)]
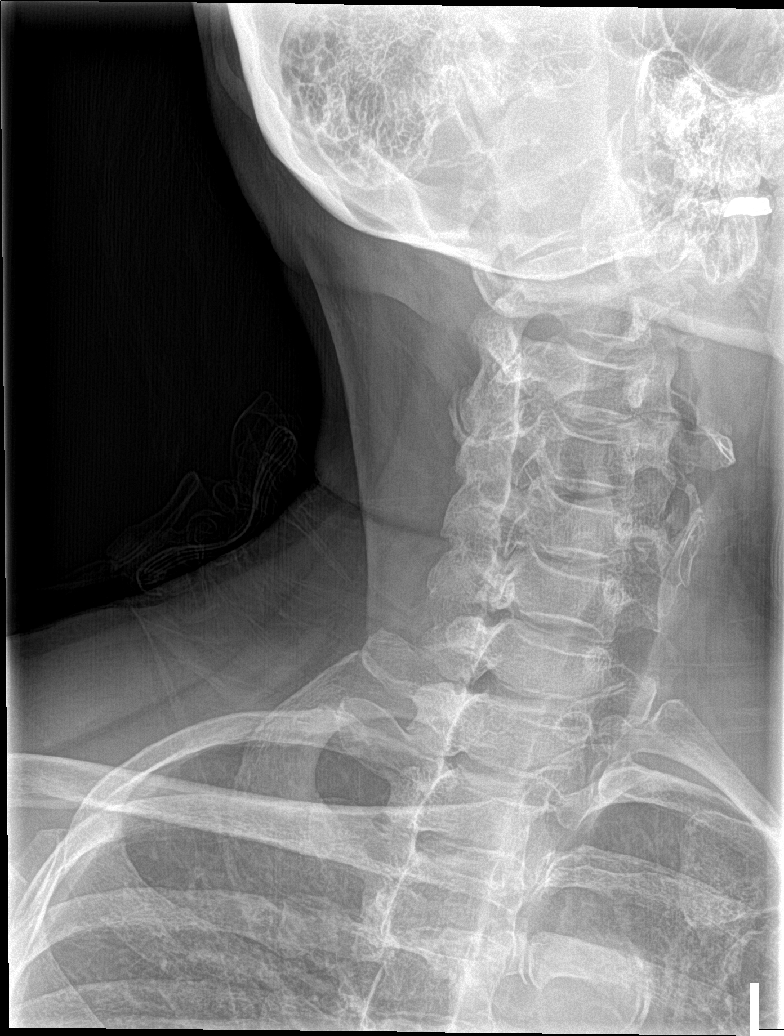

[c-spine obl (2 of 2)]
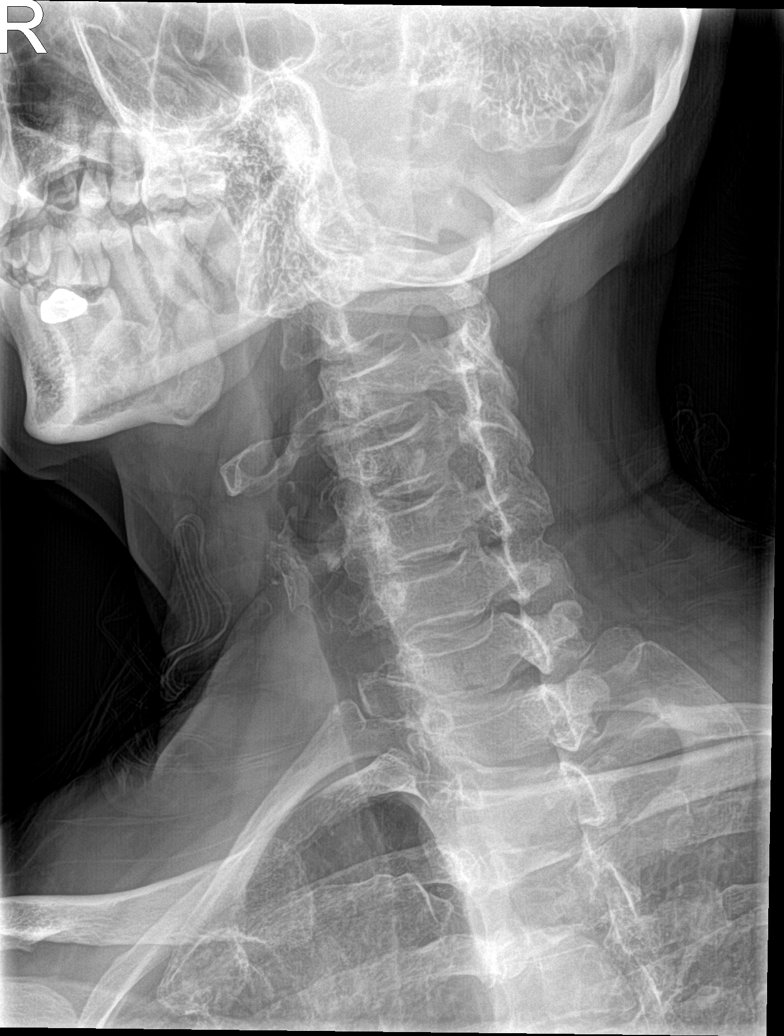

[c-spine ap]
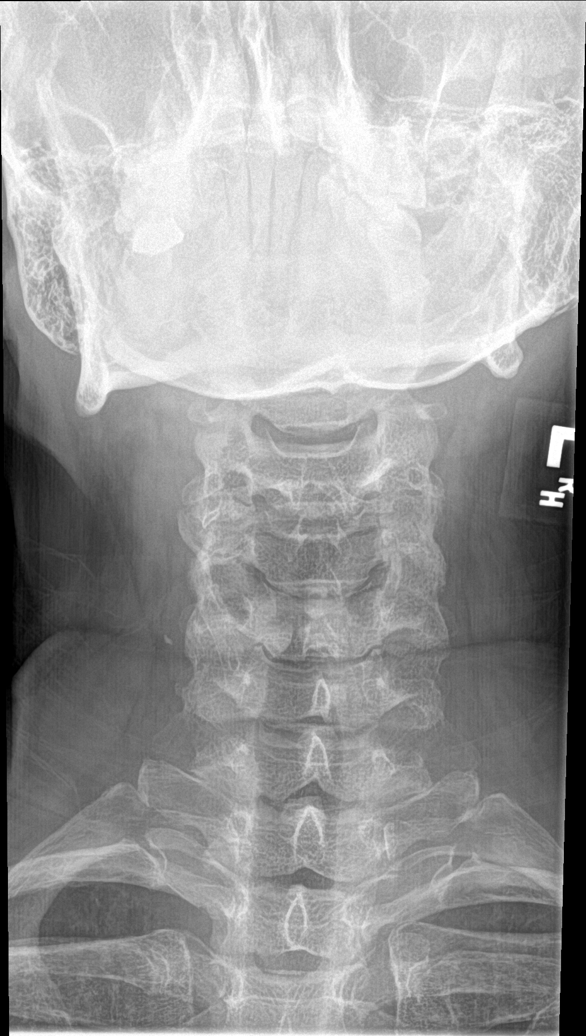

[c-spine open mouth]
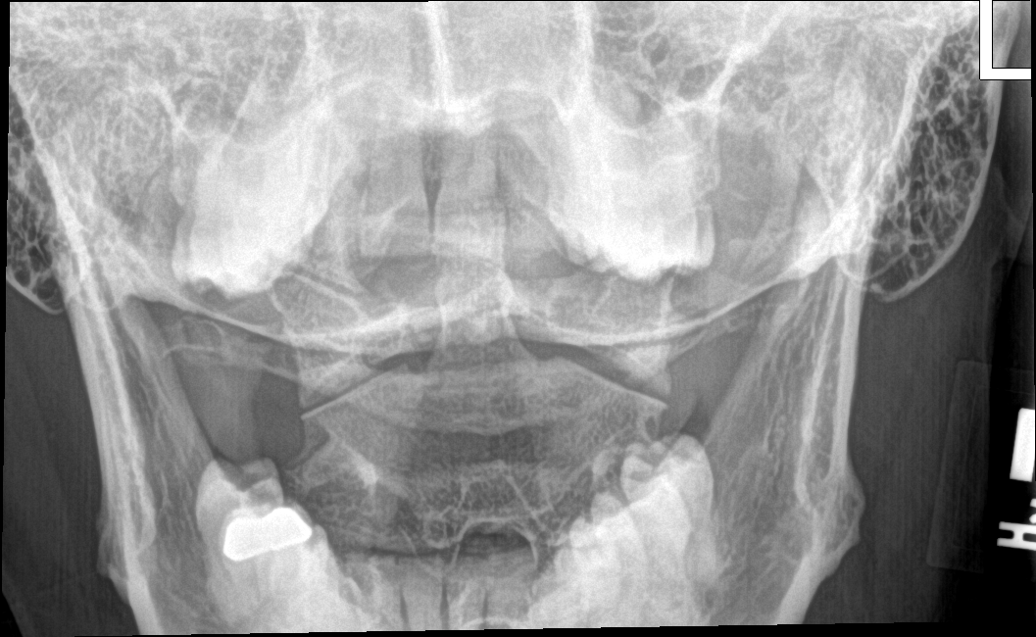

[[person_name]]
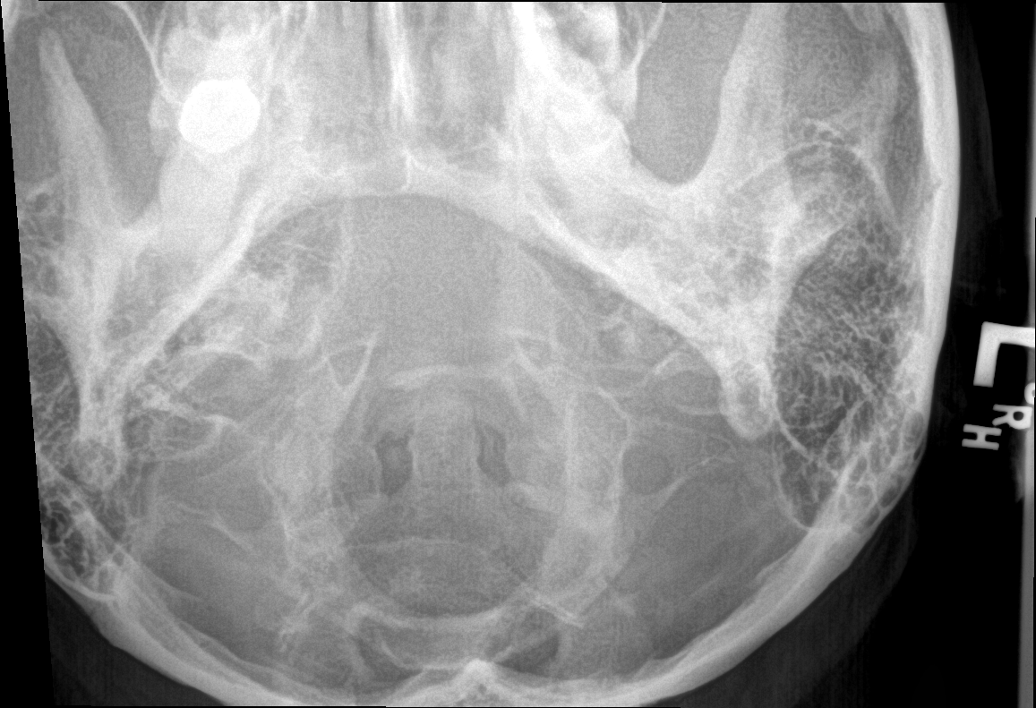

[6 of 6 positions shown; findings below may reference images not displayed]

FINDINGS: Six views of cervical spine submitted. No acute fracture or
subluxation. Minimal disc space flattening at C4-C5 and C5-C6 level.
No prevertebral soft tissue swelling. Cervical airway is patent.
C1-C2 relationship is unremarkable. Facet degenerative changes at
C2-C3 level.
IMPRESSION: No acute fracture or subluxation. No prevertebral soft tissue
swelling. Minimal disc space flattening at C4-C5 and C5-C6 level.

## 2017-03-31 MED ORDER — METOPROLOL SUCCINATE ER 25 MG PO TB24: 25.0000 mg | ORAL_TABLET | Freq: Every day | ORAL | 6 refills | Status: DC

## 2017-03-31 NOTE — Progress Notes (Signed)
Pre visit review using our clinic tool,if applicable. No additional management support is needed unless otherwise documented below in the visit note.  

## 2017-03-31 NOTE — Progress Notes (Addendum)
[facility] at [facility] [address] [place], [place] [address] [phone]  Date:  03/31/2017   Name:  Anne Christensen   DOB:  1951-04-24   MRN:  [id number]  PCP:  Abbe Amsterdam, MD    Chief Complaint: Back Pain   History of Present Illness:  Anne Christensen is a 66 y.o. very pleasant female patient who presents with the following:  She is here today with back pain- she first noted it at the end of January.  At first it was more severe- is has settled down some, but is still persistent in her left side.  Seems to be in her thoracic region to the left of her spine Tylenol does help her pain- she may use this twice a day and it really wroks She has not really had back trouble in the past,  She is not aware of any injury or inciting activity  She has not seen anyone for her back as of yet No x-rays done The pain does not go down her leg- no numbness, weakness or tingling of her leg.  No bowel or bladder control concerns   She monitors her glucose at home-  She stopped her trulicity a couple of week ago as she ran out- however she would like to see if she can be wihtout this.    She is taking still metformin 1000 BID She is also on hyzaar and norvasc- we tried her on amlodipine 10 mg but this caused too much LE sweling so we had to back her down tp 5 mg  No recent labs She is retaining more fluid since the weather got warmer- she notes that her rings are tighter and she has some more fliud in her ankles   She has noted the right 4th DIP has decreased ROM and some tenderness- wonders if she has OA.  Notes that arthritis does run in her family  My assistant ran a UA prior to visit and we noted microhematuria She has not noted any blood in her urine  No urinary sx noted Never had a kidney stone.    She has never been a smoker  Pulse Readings from Last 3 Encounters:  03/31/17 83  07/15/16 92  04/27/16 97    BP Readings from Last  3 Encounters:  03/31/17 (!) 154/66  07/15/16 (!) 142/82  04/27/16 (!) 142/90     Patient Active Problem List   Diagnosis Date Noted  . Diabetes mellitus type 2, controlled, without complications (HCC) 03/16/2016  . Epistaxis, recurrent 07/18/2013  . DJD (degenerative joint disease) 10/20/2011  . HYPERCHOLESTEROLEMIA 05/02/2008  . OVERWEIGHT 05/02/2008  . FATTY LIVER DISEASE 05/02/2008  . CHOLELITHIASIS 05/02/2008  . HYPERTENSION 05/01/2008  . ASTHMA 05/01/2008  . BACK PAIN, LUMBAR 05/01/2008  . ALLERGY 05/01/2008    Past Medical History:  Diagnosis Date  . Acute bronchitis   . Allergy, unspecified not elsewhere classified   . Arthritis   . Asthma   . Bleeding nose   . Cholelithiasis   . Depression   . Diabetes mellitus type 2, controlled, without complications (HCC) 03/16/2016  . Fatty liver   . Hypercholesterolemia   . Hypertension   . Lumbar back pain   . Overweight(278.02)     Past Surgical History:  Procedure Laterality Date  . INCISIONAL HERNIA REPAIR  2001  . VESICOVAGINAL FISTULA CLOSURE W/ TAH  2000    Social History  Substance Use Topics  .  Smoking status: Never Smoker  . Smokeless tobacco: Not on file  . Alcohol use No    Family History  Problem Relation Age of Onset  . Asthma      Allergies  Allergen Reactions  . Penicillins     Swelling, rash, hives all over    Medication list has been reviewed and updated.  Current Outpatient Prescriptions on File Prior to Visit  Medication Sig Dispense Refill  . amLODipine (NORVASC) 5 MG tablet Take 1 tablet (5 mg total) by mouth daily. 90 tablet 0  . Dulaglutide (TRULICITY) 1.5 MG/0.5ML SOPN Inject 1.5 mg into the skin once a week.     . losartan-hydrochlorothiazide (HYZAAR) 100-25 MG tablet Take 1 tablet by mouth daily. 90 tablet 0  . meloxicam (MOBIC) 7.5 MG tablet Take 1 tablet (7.5 mg total) by mouth daily. 30 tablet 0  . metFORMIN (GLUCOPHAGE) 500 MG tablet Take by mouth 2 (two) times daily  with a meal. 2 tablets at breakfast and 2 at supper.     No current facility-administered medications on file prior to visit.     Review of Systems:  As per HPI- otherwise negative. No fever, chills, CP, SOB, nausea, vomiting, diarrhea or rash  Physical Examination: Vitals:   03/31/17 1422 03/31/17 1425  BP: (!) 158/79 (!) 154/66  Pulse: 83   Temp: 98.8 F (37.1 C)    Vitals:   03/31/17 1422  Weight: 191 lb (86.6 kg)  Height:  (1.6 m)   Body mass index is 33.83 kg/m. Ideal Body Weight: Weight in (lb) to have BMI = 25: 140.8  GEN: WDWN, NAD, Non-toxic, A & O x 3, obese, looks well HEENT: Atraumatic, Normocephalic. Neck supple. No masses, No LAD.  Bilateral TM wnl, oropharynx normal.  PEERL,EOMI.   Ears and Nose: No external deformity. CV: RRR, No M/G/R. No JVD. No thrill. No extra heart sounds. PULM: CTA B, no wheezes, crackles, rhonchi. No retractions. No resp. distress. No accessory muscle use. ABD: S, NT, ND EXTR: No c/c/e NEURO Normal gait.  PSYCH: Normally interactive. Conversant. Not depressed or anxious appearing.  Calm demeanor.  Right ring finger shows likely degenerative change at the DIP joint with joint thickening and reduced ROM Normal BLE strength, sensation and DTR>  Negative SLR.  She notes some tenderness in her thoracic back to the left of the spine.  No redness, heat or skin lesion.  Normal back flexion and extension Dg Ribs Unilateral Left  Result Date: 03/31/2017 CLINICAL DATA:  Left rib pain starting end of January, no known injury EXAM: LEFT RIBS - 2 VIEW COMPARISON:  07/18/2013 FINDINGS: Two views left ribs submitted. No left rib fracture is identified. No pneumothorax. IMPRESSION: Negative. Electronically Signed   By: Natasha Mead M.D.   On: 03/31/2017 15:56   Dg Thoracic Spine 2 View  Result Date: 03/31/2017 CLINICAL DATA:  Back pain since the end of January. No history of injury. EXAM: THORACIC SPINE 2 VIEWS COMPARISON:  Chest x-ray 07/18/2013  FINDINGS: Lower thoracic spondylosis and generalized disc narrowing. No fracture deformity, endplate erosion, or evidence of focal bone lesion. IMPRESSION: 1. No acute finding. 2. Spondylosis and disc narrowing that was also seen on 2014 chest x-ray. Electronically Signed   By: Marnee Spring M.D.   On: 03/31/2017 15:59   Dg Hand Complete Right  Result Date: 03/31/2017 CLINICAL DATA:  Pain in the ring finger EXAM: RIGHT HAND - COMPLETE 3+ VIEW COMPARISON:  None. FINDINGS: No fracture or subluxation  is seen. Marked narrowing at the fourth DIP joint with bony spurring and possible tiny erosions on the oblique view. Mild narrowing of the second, third, and fifth DIP joints. No soft tissue calcifications. IMPRESSION: Second through fifth DIP arthritic changes, most marked at the fourth DIP joint. Electronically Signed   By: Jasmine Pang M.D.   On: 03/31/2017 15:59    Results for orders placed or performed in visit on 03/31/17  POCT Urinalysis Dipstick  Result Value Ref Range   Color, UA Golden    Clarity, UA Semi cloudy    Glucose, UA Neg    Bilirubin, UA Neg    Ketones, UA Neg    Spec Grav, UA 1.030 1.030 - 1.035   Blood, UA 2+    pH, UA 6.0 5.0 - 8.0   Protein, UA Neg    Urobilinogen, UA 4.0 (A) Negative - 2.0   Nitrite, UA Neg    Leukocytes, UA Negative Negative    Assessment and Plan: Right-sided thoracic back pain, unspecified chronicity - Plan: DG Thoracic Spine 2 View, POCT Urinalysis Dipstick, CANCELED: DG Ribs Unilateral Right  Microhematuria - Plan: Urine Microscopic Only, Urine culture, CBC  Controlled type 2 diabetes mellitus without complication, without long-term current use of insulin (HCC) - Plan: Comprehensive metabolic panel, Hemoglobin A1c  Essential hypertension - Plan: metoprolol succinate (TOPROL-XL) 25 MG 24 hr tablet  Pedal edema  Finger pain, right - Plan: CANCELED: DG Hand 2 View Right  Here today to discuss back pain and hand pain. Also, we noticed that  her BP is too high.  She is already on an arb, diuretic and CCB.   Her pulse is brisk, so will add a low dose of BB to her regimen.  She has a distant history of asthma sx but these are not active She has stopped her trulicity. Check A1c toay Obtain films of her back/ ribs and her hand today These are reassuring- see message to pt She will let me know if she wants a referral to PT A UA was done prior to my seeing pt which is positive for blood.  Will obtain urine micro to quantify red cells an also a urine culture  Plan next visit pending these labs    Signed Abbe Amsterdam, MD Received the rest of her labs 4/5 except for urine culture which is still pending Message to pt She has normal red cells in her urine- good news  She is on HCTZ 25 and no potassium Her A1c looks great  Results for orders placed or performed in visit on 03/31/17  Urine Microscopic Only  Result Value Ref Range   WBC, UA 0-2/hpf 0-2/hpf   RBC / HPF 0-2/hpf 0-2/hpf   Squamous Epithelial / LPF Rare(0-4/hpf) Rare(0-4/hpf)  CBC  Result Value Ref Range   WBC 11.4 (H) 4.0 - 10.5 K/uL   RBC 4.81 3.87 - 5.11 Mil/uL   Platelets 282.0 150.0 - 400.0 K/uL   Hemoglobin 13.5 12.0 - 15.0 g/dL   HCT 16.1 09.6 - 04.5 %   MCV 84.2 78.0 - 100.0 fl   MCHC 33.4 30.0 - 36.0 g/dL   RDW 40.9 81.1 - 91.4 %  Comprehensive metabolic panel  Result Value Ref Range   Sodium 140 135 - 145 mEq/L   Potassium 3.2 (L) 3.5 - 5.1 mEq/L   Chloride 101 96 - 112 mEq/L   CO2 30 19 - 32 mEq/L   Glucose, Bld 86 70 - 99 mg/dL   BUN  14 6 - 23 mg/dL   Creatinine, Ser 1.61 0.40 - 1.20 mg/dL   Total Bilirubin 0.4 0.2 - 1.2 mg/dL   Alkaline Phosphatase 39 39 - 117 U/L   AST 16 0 - 37 U/L   ALT 20 0 - 35 U/L   Total Protein 7.4 6.0 - 8.3 g/dL   Albumin 4.1 3.5 - 5.2 g/dL   Calcium 9.7 8.4 - 09.6 mg/dL   GFR 04.54 >09.81 mL/min  Hemoglobin A1c  Result Value Ref Range   Hgb A1c MFr Bld 5.6 4.6 - 6.5 %  POCT Urinalysis Dipstick  Result Value  Ref Range   Color, UA Golden    Clarity, UA Semi cloudy    Glucose, UA Neg    Bilirubin, UA Neg    Ketones, UA Neg    Spec Grav, UA 1.030 1.030 - 1.035   Blood, UA 2+    pH, UA 6.0 5.0 - 8.0   Protein, UA Neg    Urobilinogen, UA 4.0 (A) Negative - 2.0   Nitrite, UA Neg    Leukocytes, UA Negative Negative

## 2017-03-31 NOTE — Patient Instructions (Addendum)
Please go to lab and then to have x-rays on the ground floor.   I will be in touch with your labs asap, and also you x-rays We added metoprolol to your blood pressure regimen- take this once a day As you know, there was a little bit of blood in your urine- we are going to do addition urine testing for you today and I will let you know what we find out

## 2017-04-01 ENCOUNTER — Encounter: Payer: Self-pay | Admitting: Family Medicine

## 2017-04-01 LAB — COMPREHENSIVE METABOLIC PANEL
ALT: 20 U/L (ref 0–35)
AST: 16 U/L (ref 0–37)
Albumin: 4.1 g/dL (ref 3.5–5.2)
Alkaline Phosphatase: 39 U/L (ref 39–117)
BILIRUBIN TOTAL: 0.4 mg/dL (ref 0.2–1.2)
BUN: 14 mg/dL (ref 6–23)
CALCIUM: 9.7 mg/dL (ref 8.4–10.5)
CHLORIDE: 101 meq/L (ref 96–112)
CO2: 30 meq/L (ref 19–32)
Creatinine, Ser: 0.69 mg/dL (ref 0.40–1.20)
GFR: 90.74 mL/min (ref >60.00)
GLUCOSE: 86 mg/dL (ref 70–99)
Potassium: 3.2 mEq/L — ABNORMAL LOW (ref 3.5–5.1)
Sodium: 140 mEq/L (ref 135–145)
Total Protein: 7.4 g/dL (ref 6.0–8.3)

## 2017-04-01 LAB — CBC
HEMATOCRIT: 40.5 % (ref 36.0–46.0)
HEMOGLOBIN: 13.5 g/dL (ref 12.0–15.0)
MCHC: 33.4 g/dL (ref 30.0–36.0)
MCV: 84.2 fl (ref 78.0–100.0)
PLATELETS: 282 10*3/uL (ref 150.0–400.0)
RBC: 4.81 Mil/uL (ref 3.87–5.11)
RDW: 14.8 % (ref 11.5–15.5)
WBC: 11.4 10*3/uL — ABNORMAL HIGH (ref 4.0–10.5)

## 2017-04-01 LAB — URINE CULTURE: ORGANISM ID, BACTERIA: NO GROWTH

## 2017-04-01 LAB — URINALYSIS, MICROSCOPIC ONLY

## 2017-04-01 LAB — HEMOGLOBIN A1C: Hgb A1c MFr Bld: 5.6 % (ref 4.6–6.5)

## 2017-04-01 MED ORDER — POTASSIUM CHLORIDE CRYS ER 20 MEQ PO TBCR: 20.0000 meq | EXTENDED_RELEASE_TABLET | Freq: Every day | ORAL | 0 refills | Status: DC

## 2017-04-01 NOTE — Addendum Note (Signed)
Addended by: Abbe Amsterdam C on: 04/01/2017 06:25 PM   Modules accepted: Orders

## 2017-04-02 ENCOUNTER — Encounter: Payer: Self-pay | Admitting: Family Medicine

## 2017-04-03 ENCOUNTER — Other Ambulatory Visit: Payer: Self-pay | Admitting: Family Medicine

## 2017-04-03 DIAGNOSIS — I10 Essential (primary) hypertension: Secondary | ICD-10-CM

## 2017-04-29 ENCOUNTER — Ambulatory Visit (INDEPENDENT_AMBULATORY_CARE_PROVIDER_SITE_OTHER): Payer: BLUE CROSS/BLUE SHIELD | Admitting: Family Medicine

## 2017-04-29 VITALS — BP 130/82 | HR 85 | Temp 99.0°F | Ht 63.0 in | Wt 194.2 lb

## 2017-04-29 DIAGNOSIS — Z1159 Encounter for screening for other viral diseases: Secondary | ICD-10-CM

## 2017-04-29 DIAGNOSIS — E119 Type 2 diabetes mellitus without complications: Secondary | ICD-10-CM

## 2017-04-29 DIAGNOSIS — E876 Hypokalemia: Secondary | ICD-10-CM

## 2017-04-29 DIAGNOSIS — I10 Essential (primary) hypertension: Secondary | ICD-10-CM

## 2017-04-29 MED ORDER — METOPROLOL SUCCINATE ER 25 MG PO TB24: 25.0000 mg | ORAL_TABLET | Freq: Every day | ORAL | 3 refills | Status: DC

## 2017-04-29 MED ORDER — DULAGLUTIDE 0.75 MG/0.5ML ~~LOC~~ SOAJ: SUBCUTANEOUS | 3 refills | Status: DC

## 2017-04-29 MED ORDER — METFORMIN HCL 500 MG PO TABS: ORAL_TABLET | ORAL | 3 refills | Status: DC

## 2017-04-29 NOTE — Patient Instructions (Signed)
It was great to see you today- I will be in touch with your labs  Your blood pressure looks very good Try the metoprolol for another month or so; however if it continues to make you feel tired we can try changing to another medication We will start you back on trulicity at 0.75 mg a week.  Assuming your blood sugar looks good you can then decrease your metformin to 1000 mg/ day if you like  Please see me in about 3 months for a recheck

## 2017-04-29 NOTE — Progress Notes (Signed)
[facility] [address], [address] [place], [place] [address] [phone]  Date:  04/29/2017   Name:  Anne Christensen   DOB:  Oct 16, 1951   MRN:  [id number]  PCP:  Abbe Amsterdam, MD    Chief Complaint: Follow-up (Would like to discuss Metroprol causing fatigue, elevated bp and elevated blood sugar)   History of Present Illness:  Anne Christensen is a 66 y.o. very pleasant female patient who presents with the following:  Last seen by myself about one month ago- we had asked her to come back for a visit so we can check her K and her BP We started her on metoprolol at over last visit, added to her amlodipine and hyzaar.  We tried 10 mg of amlodipine but she had swelling so we had to decrease this to 5 mg We also had to replete her potassium at last visit- she had a hard time taking the pills but managed to take them as directed for 5 days   BP Readings from Last 3 Encounters:  04/29/17 130/82  03/31/17 (!) 154/66  07/15/16 (!) 142/82   She stopped her trulicity a couple of weeks prior to her last visit as she forgot to take it.  Her A1c looked great while she was on this medication.  She has noted that her glucose is creeping up and she has gained a few lbs.  Would like to go back on trulictiy but would like to try the lower dose if possible She is still taking metformin 1000 BID   Her pulse is not slow with the addition of a BB but she does feel that she has less energy   Pulse Readings from Last 3 Encounters:  04/29/17 85  03/31/17 83  07/15/16 92    Wt Readings from Last 3 Encounters:  04/29/17 194 lb 3.2 oz (88.1 kg)  03/31/17 191 lb (86.6 kg)  07/15/16 188 lb 3.2 oz (85.4 kg)   Lab Results  Component Value Date   HGBA1C 5.6 03/31/2017    Patient Active Problem List   Diagnosis Date Noted  . Diabetes mellitus type 2, controlled, without complications (HCC) 03/16/2016  . Epistaxis, recurrent 07/18/2013  . DJD  (degenerative joint disease) 10/20/2011  . HYPERCHOLESTEROLEMIA 05/02/2008  . OVERWEIGHT 05/02/2008  . FATTY LIVER DISEASE 05/02/2008  . CHOLELITHIASIS 05/02/2008  . HYPERTENSION 05/01/2008  . ASTHMA 05/01/2008  . BACK PAIN, LUMBAR 05/01/2008  . ALLERGY 05/01/2008    Past Medical History:  Diagnosis Date  . Acute bronchitis   . Allergy, unspecified not elsewhere classified   . Arthritis   . Asthma   . Bleeding nose   . Cholelithiasis   . Depression   . Diabetes mellitus type 2, controlled, without complications (HCC) 03/16/2016  . Fatty liver   . Hypercholesterolemia   . Hypertension   . Lumbar back pain   . Overweight(278.02)     Past Surgical History:  Procedure Laterality Date  . INCISIONAL HERNIA REPAIR  2001  . VESICOVAGINAL FISTULA CLOSURE W/ TAH  2000    Social History  Substance Use Topics  . Smoking status: Never Smoker  . Smokeless tobacco: Not on file  . Alcohol use No    Family History  Problem Relation Age of Onset  . Asthma      Allergies  Allergen Reactions  . Penicillins     Swelling, rash, hives all over    Medication list has been reviewed  and updated.  Current Outpatient Prescriptions on File Prior to Visit  Medication Sig Dispense Refill  . amLODipine (NORVASC) 5 MG tablet TAKE 1 TABLET(5 MG) BY MOUTH DAILY 90 tablet 0  . losartan-hydrochlorothiazide (HYZAAR) 100-25 MG tablet TAKE 1 TABLET BY MOUTH DAILY 90 tablet 0  . meloxicam (MOBIC) 7.5 MG tablet Take 1 tablet (7.5 mg total) by mouth daily. 30 tablet 0  . metFORMIN (GLUCOPHAGE) 500 MG tablet Take by mouth 2 (two) times daily with a meal. 2 tablets at breakfast and 2 at supper.    . metoprolol succinate (TOPROL-XL) 25 MG 24 hr tablet Take 1 tablet (25 mg total) by mouth daily. 30 tablet 6  . potassium chloride SA (K-DUR,KLOR-CON) 20 MEQ tablet Take 1 tablet (20 mEq total) by mouth daily. Take for 5 days ONLY- keep the rest of bottle as may be needed later 30 tablet 0   No current  facility-administered medications on file prior to visit.     Review of Systems:  As per HPI- otherwise negative.  No fever, chills, CP, SOB, rash, nausea, vomiting, diarrhea   Physical Examination: Vitals:   04/29/17 1507  BP: 130/82  Pulse: 85  Temp: 99 F (37.2 C)   Vitals:   04/29/17 1507  Weight: 194 lb 3.2 oz (88.1 kg)  Height: 5\' 3"  (1.6 m)   Body mass index is 34.4 kg/m. Ideal Body Weight: Weight in (lb) to have BMI = 25: 140.8  GEN: WDWN, NAD, Non-toxic, A & O x 3 HEENT: Atraumatic, Normocephalic. Neck supple. No masses, No LAD. Ears and Nose: No external deformity. CV: RRR, No M/G/R. No JVD. No thrill. No extra heart sounds. PULM: CTA B, no wheezes, crackles, rhonchi. No retractions. No resp. distress. No accessory muscle use. EXTR: No c/c/e NEURO Normal gait.  PSYCH: Normally interactive. Conversant. Not depressed or anxious appearing.  Calm demeanor.  Obese, otherwise looks well  Assessment and Plan: Controlled type 2 diabetes mellitus without complication, without long-term current use of insulin (HCC) - Plan: Dulaglutide (TRULICITY) 0.75 MG/0.5ML SOPN, metFORMIN (GLUCOPHAGE) 500 MG tablet  Essential hypertension - Plan: metoprolol succinate (TOPROL-XL) 25 MG 24 hr tablet  Hypokalemia - Plan: Basic metabolic panel  Encounter for hepatitis C screening test for low risk patient - Plan: Hepatitis C antibody  History of DM- well controlled but she stopped her trilicity which she had been taking.  Will start her back on this at the 0.75 mg strength  She will let me know if any hypoglycemia Will plan to do an A1c in 3 months Hep C pending BMP to check on her K  She will try the BB for another month or so- will let me know if fatigue does not resolve   Signed Abbe AmsterdamJessica Akshaj Besancon, MD

## 2017-04-30 LAB — BASIC METABOLIC PANEL
BUN: 16 mg/dL (ref 6–23)
CALCIUM: 9.7 mg/dL (ref 8.4–10.5)
CO2: 28 mEq/L (ref 19–32)
CREATININE: 0.74 mg/dL (ref 0.40–1.20)
Chloride: 100 mEq/L (ref 96–112)
GFR: 83.68 mL/min (ref >60.00)
Glucose, Bld: 82 mg/dL (ref 70–99)
POTASSIUM: 3.5 meq/L (ref 3.5–5.1)
SODIUM: 139 meq/L (ref 135–145)

## 2017-04-30 LAB — HEPATITIS C ANTIBODY: HCV AB: NEGATIVE

## 2017-06-15 ENCOUNTER — Encounter: Payer: Self-pay | Admitting: Family Medicine

## 2017-07-06 ENCOUNTER — Other Ambulatory Visit: Payer: Self-pay | Admitting: Family Medicine

## 2017-07-06 DIAGNOSIS — I10 Essential (primary) hypertension: Secondary | ICD-10-CM

## 2017-07-13 ENCOUNTER — Encounter: Payer: Self-pay | Admitting: Family Medicine

## 2017-10-05 ENCOUNTER — Other Ambulatory Visit: Payer: Self-pay | Admitting: Family Medicine

## 2017-10-05 DIAGNOSIS — I10 Essential (primary) hypertension: Secondary | ICD-10-CM

## 2017-10-25 NOTE — Progress Notes (Addendum)
[facility] [address], [address] [place], [place] [address] [phone]  Date:  10/27/2017   Name:  Anne Christensen   DOB:  [birth year]   MRN:  [id number]  PCP:  Pearline Cables, MD    Chief Complaint: Follow-up (Pt here for follow up visit. Fasting for labs. )   History of Present Illness:  Anne Christensen is a 66 y.o. very pleasant female patient who presents with the following:  History of DM, hyperlipidemia, HTN, asthma Here today for a follow-up visit I last saw her in May of this year:  We started her on metoprolol at over last visit, added to her amlodipine and hyzaar.  We tried 10 mg of amlodipine but she had swelling so we had to decrease this to 5 mg We also had to replete her potassium at last visit- she had a hard time taking the pills but managed to take them as directed for 5 days   She stopped her trulicity a couple of weeks prior to her last visit as she forgot to take it.  Her A1c looked great while she was on this medication.  She has noted that her glucose is creeping up and she has gained a few lbs.  Would like to go back on trulictiy but would like to try the lower dose if possible She is still taking metformin 1000 BID   Her pulse is not slow with the addition of a BB but she does feel that she has less energy   Since our last visit, she has felt well.   She is fasting today for labs Her BP was a bit high at a health fair in July, as was her cholesterol and glucose. However she is not sure if she quite believes these numbers and would like to redo today which is fine  Pap: hysterectomy done in [place] 2000, for fibroids. No longer needs paps Mammo: overdue, pt wonders if she can get this done today Dexa: will set up for later  Pneumonia: pt declines  Optho: 12/28/16 Flu: declines   Lab Results  Component Value Date   HGBA1C 5.6 03/31/2017   She is taking metformin 1000 just once a day She is taking  her trulicity but is not sure if she still needs this- her glucose has been ok and her last A1c was quite normal.  This med does not seem to be helping her lose weight and is expensive.  She would rather just use metformin if she could  She has gained a few lbs  She has noted some left hip pain for 4-5 weeks, can even make her limp sometimes  This is not back pain- it is in the hip She tried tylenol, aleve, heat- nothing helped but some naproxen did help with this. She had an old rx on hand Hurts all the time and she cannot lie on her left side in bed She does not have any numbness or weakness down the leg  Wt Readings from Last 3 Encounters:  10/27/17 198 lb 12.8 oz (90.2 kg)  04/29/17 194 lb 3.2 oz (88.1 kg)  03/31/17 191 lb (86.6 kg)    BP Readings from Last 3 Encounters:  10/27/17 124/88  04/29/17 130/82  03/31/17 (!) 154/66      Patient Active Problem List   Diagnosis Date Noted  . Diabetes mellitus type 2, controlled, without complications (HCC) 03/16/2016  . Epistaxis, recurrent 07/18/2013  .  DJD (degenerative joint disease) 10/20/2011  . HYPERCHOLESTEROLEMIA 05/02/2008  . OVERWEIGHT 05/02/2008  . FATTY LIVER DISEASE 05/02/2008  . CHOLELITHIASIS 05/02/2008  . HYPERTENSION 05/01/2008  . ASTHMA 05/01/2008  . BACK PAIN, LUMBAR 05/01/2008  . ALLERGY 05/01/2008    Past Medical History:  Diagnosis Date  . Acute bronchitis   . Allergy, unspecified not elsewhere classified   . Arthritis   . Asthma   . Bleeding nose   . Cholelithiasis   . Depression   . Diabetes mellitus type 2, controlled, without complications (HCC) 03/16/2016  . Fatty liver   . Hypercholesterolemia   . Hypertension   . Lumbar back pain   . Overweight(278.02)     Past Surgical History:  Procedure Laterality Date  . INCISIONAL HERNIA REPAIR  2001  . VESICOVAGINAL FISTULA CLOSURE W/ TAH  2000    Social History  Substance Use Topics  . Smoking status: Never Smoker  . Smokeless tobacco: Not  on file  . Alcohol use No    Family History  Problem Relation Age of Onset  . Asthma Unknown     Allergies  Allergen Reactions  . Penicillins     Swelling, rash, hives all over    Medication list has been reviewed and updated.  Current Outpatient Prescriptions on File Prior to Visit  Medication Sig Dispense Refill  . amLODipine (NORVASC) 5 MG tablet TAKE 1 TABLET(5 MG) BY MOUTH DAILY 90 tablet 0  . Dulaglutide (TRULICITY) 0.75 MG/0.5ML SOPN Use once a week for diabetes 12 pen 3  . losartan-hydrochlorothiazide (HYZAAR) 100-25 MG tablet TAKE 1 TABLET BY MOUTH DAILY 90 tablet 0  . metFORMIN (GLUCOPHAGE) 500 MG tablet Take 2 tablets twice daily for blood sugar control 360 tablet 3  . metoprolol succinate (TOPROL-XL) 25 MG 24 hr tablet Take 1 tablet (25 mg total) by mouth daily. 90 tablet 3  . potassium chloride SA (K-DUR,KLOR-CON) 20 MEQ tablet Take 1 tablet (20 mEq total) by mouth daily. Take for 5 days ONLY- keep the rest of bottle as may be needed later 30 tablet 0   No current facility-administered medications on file prior to visit.     Review of Systems:  As per HPI- otherwise negative. No fever or chills No CP or SOB No acute injury to hip known    Physical Examination: Vitals:   10/27/17 0846  BP: 124/88  Pulse: 92  Temp: 98.6 F (37 C)  SpO2: 98%   Vitals:   10/27/17 0846  Weight: 198 lb 12.8 oz (90.2 kg)  Height: 5\' 3"  (1.6 m)   Body mass index is 35.22 kg/m. Ideal Body Weight: Weight in (lb) to have BMI = 25: 140.8  GEN: WDWN, NAD, Non-toxic, A & O x 3, obese, otherwise looks well HEENT: Atraumatic, Normocephalic. Neck supple. No masses, No LAD.  Bilateral TM wnl, oropharynx normal.  PEERL,EOMI.   Ears and Nose: No external deformity. CV: RRR, No M/G/R. No JVD. No thrill. No extra heart sounds. PULM: CTA B, no wheezes, crackles, rhonchi. No retractions. No resp. distress. No accessory muscle use. ABD: S, NT, ND. No rebound. No HSM. EXTR: No  c/c/e NEURO Normal gait.  PSYCH: Normally interactive. Conversant. Not depressed or anxious appearing.  Calm demeanor.  Left hip: she does have some pain with flexed abduction and adduction.  No pain with internal or external rotation of the relaxed leg Normal strength of the BLE. Negative SLR  Assessment and Plan: Essential hypertension  Controlled type 2 diabetes mellitus  without complication, without long-term current use of insulin (HCC) - Plan: CBC, Hemoglobin A1c  Hyperlipidemia associated with type 2 diabetes mellitus (HCC) - Plan: Comprehensive metabolic panel, Lipid panel  Left hip pain - Plan: DG HIP UNILAT W OR W/O PELVIS 2-3 VIEWS LEFT, naproxen (NAPROSYN) 500 MG tablet  Estrogen deficiency - Plan: DG Bone Density  Screening for breast cancer - Plan: MM SCREENING BREAST TOMO BILATERAL  Here today for a follow- up visit Will follow-up on her DM and lipids today Arranged for a mammo today Will obtain plain hip films today for her and follow-up   Signed Abbe AmsterdamJessica Clemence Stillings, MD  Received her labs and reports  Results for orders placed or performed in visit on 10/27/17  CBC  Result Value Ref Range   WBC 10.7 (H) 4.0 - 10.5 K/uL   RBC 4.89 3.87 - 5.11 Mil/uL   Platelets 253.0 150.0 - 400.0 K/uL   Hemoglobin 14.0 12.0 - 15.0 g/dL   HCT 16.141.6 09.636.0 - 04.546.0 %   MCV 85.1 78.0 - 100.0 fl   MCHC 33.8 30.0 - 36.0 g/dL   RDW 40.914.7 81.111.5 - 91.415.5 %  Comprehensive metabolic panel  Result Value Ref Range   Sodium 140 135 - 145 mEq/L   Potassium 3.1 (L) 3.5 - 5.1 mEq/L   Chloride 100 96 - 112 mEq/L   CO2 28 19 - 32 mEq/L   Glucose, Bld 99 70 - 99 mg/dL   BUN 14 6 - 23 mg/dL   Creatinine, Ser 7.820.56 0.40 - 1.20 mg/dL   Total Bilirubin 0.6 0.2 - 1.2 mg/dL   Alkaline Phosphatase 46 39 - 117 U/L   AST 17 0 - 37 U/L   ALT 22 0 - 35 U/L   Total Protein 7.7 6.0 - 8.3 g/dL   Albumin 4.1 3.5 - 5.2 g/dL   Calcium 9.9 8.4 - 95.610.5 mg/dL   GFR 213.08115.25 >65.78>60.00 mL/min  Hemoglobin A1c  Result  Value Ref Range   Hgb A1c MFr Bld 5.6 4.6 - 6.5 %  Lipid panel  Result Value Ref Range   Cholesterol 165 0 - 200 mg/dL   Triglycerides 469.6136.0 0.0 - 149.0 mg/dL   HDL 29.5251.20 >84.13>39.00 mg/dL   VLDL 24.427.2 0.0 - 01.040.0 mg/dL   LDL Cholesterol 87 0 - 99 mg/dL   Total CHOL/HDL Ratio 3    NonHDL 114.03    Dg Bone Density  Result Date: 10/27/2017 EXAM: DUAL X-RAY ABSORPTIOMETRY (DXA) FOR BONE MINERAL DENSITY IMPRESSION: Referring Physician:  Pearline CablesJESSICA C Rosalinda Seaman PATIENT: Name: Adalberto IllWright, Tashari Patient ID: [id number] Birth Date: 1951-06-25 Height: 62.0 in. Sex: Female Measured: 10/27/2017 Weight: 196.0 lbs. Indications: Advanced Age, Caucasian, Diabetic, Estrogen Deficiency, Hysterectomy, Oophorectomy(Unilateral), Post Menopausal, Vitamin D Deficiency Fractures: Treatments: ASSESSMENT: The BMD measured at Femur Neck Left is 0.847 g/cm2 with a T-score of -1.4. This patient is considered osteopenic according to World Health Organization [facility](WHO) criteria. L-1 was excluded due to degenerative changes. Site Region Measured Date Measured Age WHO YA BMD Classification T-score AP Spine L2-L4 10/27/2017 65.6 years Normal -0.9 1.097 g/cm2 DualFemur Neck Left 10/27/2017 65.6 years Osteopenia -1.4 0.847 g/cm2 World Health Organization [facility](WHO) criteria for post-menopausal, Caucasian Women: Normal       T-score at or above -1 SD Osteopenia   T-score between -1 and -2.5 SD Osteoporosis T-score at or below -2.5 SD RECOMMENDATION: National Osteoporosis Foundation recommends that FDA-approved medical therapies be considered in postmenopausal women and men age 65 or older with a: 1. Hip or vertebral (  clinical or morphometric) fracture. 2. T-score of < -2.5 at the spine or hip. 3. Ten-year fracture probability by FRAX of 3% or greater for hip fracture or 20% or greater for major osteoporotic fracture. All treatment decisions require clinical judgment and consideration of individual patient factors, including patient preferences, co-morbidities,  previous drug use, risk factors not captured in the FRAX model (e.g. falls, vitamin D deficiency, increased bone turnover, interval significant decline in bone density) and possible under - or over-estimation of fracture risk by FRAX. All patients should ensure an adequate intake of dietary calcium (1200 mg/d) and vitamin D (800 IU daily) unless contraindicated. FOLLOW-UP: People with diagnosed cases of osteoporosis or at high risk for fracture should have regular bone mineral density tests. For patients eligible for Medicare, routine testing is allowed once every 2 years. The testing frequency can be increased to one year for patients who have rapidly progressing disease, those who are receiving or discontinuing medical therapy to restore bone mass, or have additional risk factors. I have reviewed this report and agree with the above findings. [facility] Patient: Adalberto Ill   Referring Physician: Pearline Cables Birth Date: 12/06/1951 [age] Patient ID: [id number] Height: 62.0 in. Weight: 196.0 lbs. Measured: 10/27/2017 10:09:48 AM (16 SP 2) Sex: Female Ethnicity: White Analyzed: 10/27/2017 10:10:52 AM (16 SP 2) FRAX* 10-year Probability of Fracture Based on femoral neck BMD: DualFemur (Left) Major Osteoporotic Fracture: 8.0% Hip Fracture:                0.8% Population:                  [place] (Caucasian) Risk Factors:                None *FRAX is a Armed forces logistics/support/administrative officer of the [employer] of [employer] for Metabolic Bone Disease, a World Science writer (WHO) [employer]. ASSESSMENT: The probability of a major osteoporotic fracture is 8.0% within the next ten years. The probability of a hip fracture is 0.8% within the next ten years. Electronically Signed   By: Charlett Nose M.D.   On: 10/27/2017 10:47   Mm Screening Breast Tomo Bilateral  Result Date: 10/27/2017 CLINICAL DATA:  Screening. EXAM: 2D DIGITAL SCREENING BILATERAL MAMMOGRAM WITH CAD AND ADJUNCT TOMO  COMPARISON:  Previous exam(s). ACR Breast Density Category b: There are scattered areas of fibroglandular density. FINDINGS: There are no findings suspicious for malignancy. Images were processed with CAD. IMPRESSION: No mammographic evidence of malignancy. A result letter of this screening mammogram will be mailed directly to the patient. RECOMMENDATION: Screening mammogram in one year. (Code:SM-B-01Y) BI-RADS CATEGORY  1: Negative. Electronically Signed   By: Baird Lyons M.D.   On: 10/27/2017 16:10   Dg Hip Unilat W Or W/o Pelvis 2-3 Views Left  Result Date: 10/27/2017 CLINICAL DATA:  Chronic pain EXAM: DG HIP (WITH OR WITHOUT PELVIS) 2-3V LEFT COMPARISON:  None. FINDINGS: Frontal pelvis as well as frontal and lateral left hip images were obtained. There is no evident fracture or dislocation. There is mild symmetric narrowing of both hip joints. There is an apparent bone island in the femoral neck region on the left. Calcification lateral to the iliac crest on the left is likely due to myositis ossificans. Sacroiliac joints appear normal bilaterally. IMPRESSION: Mild symmetric narrowing of both hip joints. Mild myositis ossificans in the left lateral buttocks region. No fracture or dislocation. Electronically Signed   By: Chrissie Noa  Margarita Grizzle III M.D.   On: 10/27/2017 11:14

## 2017-10-27 ENCOUNTER — Ambulatory Visit (HOSPITAL_BASED_OUTPATIENT_CLINIC_OR_DEPARTMENT_OTHER)
Admission: RE | Admit: 2017-10-27 | Discharge: 2017-10-27 | Disposition: A | Discharge status: Home / Self Care | Payer: BLUE CROSS/BLUE SHIELD | Source: Ambulatory Visit | Attending: Family Medicine | Admitting: Family Medicine

## 2017-10-27 ENCOUNTER — Encounter (HOSPITAL_BASED_OUTPATIENT_CLINIC_OR_DEPARTMENT_OTHER): Payer: Self-pay

## 2017-10-27 ENCOUNTER — Ambulatory Visit (INDEPENDENT_AMBULATORY_CARE_PROVIDER_SITE_OTHER): Payer: BLUE CROSS/BLUE SHIELD | Admitting: Family Medicine

## 2017-10-27 ENCOUNTER — Encounter: Payer: Self-pay | Admitting: Family Medicine

## 2017-10-27 VITALS — BP 124/88 | HR 92 | Temp 98.6°F | Ht 63.0 in | Wt 198.8 lb

## 2017-10-27 DIAGNOSIS — E119 Type 2 diabetes mellitus without complications: Secondary | ICD-10-CM

## 2017-10-27 DIAGNOSIS — M6158 Other ossification of muscle, other site: Secondary | ICD-10-CM | POA: Diagnosis not present

## 2017-10-27 DIAGNOSIS — Z13820 Encounter for screening for osteoporosis: Secondary | ICD-10-CM | POA: Diagnosis present

## 2017-10-27 DIAGNOSIS — M25552 Pain in left hip: Secondary | ICD-10-CM

## 2017-10-27 DIAGNOSIS — E785 Hyperlipidemia, unspecified: Secondary | ICD-10-CM | POA: Diagnosis not present

## 2017-10-27 DIAGNOSIS — Z1231 Encounter for screening mammogram for malignant neoplasm of breast: Secondary | ICD-10-CM | POA: Diagnosis present

## 2017-10-27 DIAGNOSIS — E1169 Type 2 diabetes mellitus with other specified complication: Secondary | ICD-10-CM

## 2017-10-27 DIAGNOSIS — I10 Essential (primary) hypertension: Secondary | ICD-10-CM

## 2017-10-27 DIAGNOSIS — Z1239 Encounter for other screening for malignant neoplasm of breast: Secondary | ICD-10-CM

## 2017-10-27 DIAGNOSIS — E2839 Other primary ovarian failure: Secondary | ICD-10-CM

## 2017-10-27 DIAGNOSIS — M85852 Other specified disorders of bone density and structure, left thigh: Secondary | ICD-10-CM | POA: Diagnosis not present

## 2017-10-27 DIAGNOSIS — E876 Hypokalemia: Secondary | ICD-10-CM

## 2017-10-27 LAB — CBC
HCT: 41.6 % (ref 36.0–46.0)
Hemoglobin: 14 g/dL (ref 12.0–15.0)
MCHC: 33.8 g/dL (ref 30.0–36.0)
MCV: 85.1 fl (ref 78.0–100.0)
PLATELETS: 253 10*3/uL (ref 150.0–400.0)
RBC: 4.89 Mil/uL (ref 3.87–5.11)
RDW: 14.7 % (ref 11.5–15.5)
WBC: 10.7 10*3/uL — AB (ref 4.0–10.5)

## 2017-10-27 LAB — COMPREHENSIVE METABOLIC PANEL
ALK PHOS: 46 U/L (ref 39–117)
ALT: 22 U/L (ref 0–35)
AST: 17 U/L (ref 0–37)
Albumin: 4.1 g/dL (ref 3.5–5.2)
BUN: 14 mg/dL (ref 6–23)
CALCIUM: 9.9 mg/dL (ref 8.4–10.5)
CO2: 28 meq/L (ref 19–32)
Chloride: 100 mEq/L (ref 96–112)
Creatinine, Ser: 0.56 mg/dL (ref 0.40–1.20)
GFR: 115.25 mL/min (ref >60.00)
Glucose, Bld: 99 mg/dL (ref 70–99)
Potassium: 3.1 mEq/L — ABNORMAL LOW (ref 3.5–5.1)
Sodium: 140 mEq/L (ref 135–145)
TOTAL PROTEIN: 7.7 g/dL (ref 6.0–8.3)
Total Bilirubin: 0.6 mg/dL (ref 0.2–1.2)

## 2017-10-27 LAB — HEMOGLOBIN A1C: HEMOGLOBIN A1C: 5.6 % (ref 4.6–6.5)

## 2017-10-27 LAB — LIPID PANEL
Cholesterol: 165 mg/dL (ref 0–200)
HDL: 51.2 mg/dL (ref >39.00)
LDL Cholesterol: 87 mg/dL (ref 0–99)
NonHDL: 114.03
Total CHOL/HDL Ratio: 3
Triglycerides: 136 mg/dL (ref 0.0–149.0)
VLDL: 27.2 mg/dL (ref 0.0–40.0)

## 2017-10-27 MED ORDER — NAPROXEN 500 MG PO TABS: 500.0000 mg | ORAL_TABLET | Freq: Two times a day (BID) | ORAL | 1 refills | Status: DC

## 2017-10-27 NOTE — Patient Instructions (Signed)
It was nice to see you today!  Please go to lab and then downstairs for your hip film and mammogram. Then you can head home and I will be in touch with your labs and films asap

## 2017-10-27 NOTE — Addendum Note (Signed)
Addended by: Abbe AmsterdamOPLAND, Kandy Towery C on: 10/27/2017 05:03 PM   Modules accepted: Orders

## 2017-10-28 ENCOUNTER — Encounter: Payer: Self-pay | Admitting: Family Medicine

## 2017-12-25 ENCOUNTER — Other Ambulatory Visit: Payer: Self-pay | Admitting: Family Medicine

## 2017-12-25 DIAGNOSIS — I10 Essential (primary) hypertension: Secondary | ICD-10-CM

## 2018-02-03 ENCOUNTER — Encounter: Payer: Self-pay | Admitting: Family Medicine

## 2018-02-03 ENCOUNTER — Ambulatory Visit: Payer: BLUE CROSS/BLUE SHIELD | Admitting: Family Medicine

## 2018-02-03 VITALS — BP 132/82 | HR 97 | Temp 98.4°F | Ht 63.0 in | Wt 206.2 lb

## 2018-02-03 DIAGNOSIS — E876 Hypokalemia: Secondary | ICD-10-CM

## 2018-02-03 DIAGNOSIS — J209 Acute bronchitis, unspecified: Secondary | ICD-10-CM | POA: Diagnosis not present

## 2018-02-03 DIAGNOSIS — R05 Cough: Secondary | ICD-10-CM | POA: Diagnosis not present

## 2018-02-03 DIAGNOSIS — R059 Cough, unspecified: Secondary | ICD-10-CM

## 2018-02-03 DIAGNOSIS — E118 Type 2 diabetes mellitus with unspecified complications: Secondary | ICD-10-CM | POA: Diagnosis not present

## 2018-02-03 DIAGNOSIS — D72829 Elevated white blood cell count, unspecified: Secondary | ICD-10-CM | POA: Diagnosis not present

## 2018-02-03 MED ORDER — HYDROCODONE-HOMATROPINE 5-1.5 MG/5ML PO SYRP: 5.0000 mL | ORAL_SOLUTION | Freq: Three times a day (TID) | ORAL | 0 refills | Status: DC | PRN

## 2018-02-03 MED ORDER — DOXYCYCLINE HYCLATE 100 MG PO CAPS: 100.0000 mg | ORAL_CAPSULE | Freq: Two times a day (BID) | ORAL | 0 refills | Status: DC

## 2018-02-03 NOTE — Progress Notes (Addendum)
Healthcare at [facility] [address] [place], [place] [address] [phone]  Date:  02/03/2018   Name:  Anne Christensen   DOB:  1951-05-29   MRN:  [id number]  PCP:  Anne Cables, MD    Chief Complaint: Cough (c/o cough, nasal congestion. Pt states she was sick a few weeks ago, got better and now sick again. )   History of Present Illness:  Anne Christensen is a 67 y.o. very pleasant female patient who presents with the following:  Here today for a sick visit I last saw her in October to check on her DM We need to follow-up on her wbc count and potassium  Foot exam is due Eye exam:  She is going in 2 weeks   We stopped her trulicity in October, she is just on metformin now.  Will want to repeat her A1c  She notes that she had "a cold" at the start of January- she got better about 10 days later, but then it came back. She notes a nagging cough She has not measured a fever  Cough is not productive any longer- it was at first  Her throat feels clogged with mucus She has intermittent sinus pressure and congestion She is taking a tylenol product for severe cold and flu  No vomiting or diarrhea She did have a ST but this is now resolved  Her glucose seems to be doing ok with just the metformin once a day  Patient Active Problem List   Diagnosis Date Noted  . Diabetes mellitus type 2, controlled, without complications (HCC) 03/16/2016  . Epistaxis, recurrent 07/18/2013  . DJD (degenerative joint disease) 10/20/2011  . HYPERCHOLESTEROLEMIA 05/02/2008  . OVERWEIGHT 05/02/2008  . FATTY LIVER DISEASE 05/02/2008  . CHOLELITHIASIS 05/02/2008  . HYPERTENSION 05/01/2008  . ASTHMA 05/01/2008  . BACK PAIN, LUMBAR 05/01/2008  . ALLERGY 05/01/2008    Past Medical History:  Diagnosis Date  . Acute bronchitis   . Allergy, unspecified not elsewhere classified   . Arthritis   . Asthma   . Bleeding nose   . Cholelithiasis   .  Depression   . Diabetes mellitus type 2, controlled, without complications (HCC) 03/16/2016  . Fatty liver   . Hypercholesterolemia   . Hypertension   . Lumbar back pain   . Overweight(278.02)     Past Surgical History:  Procedure Laterality Date  . INCISIONAL HERNIA REPAIR  2001  . VESICOVAGINAL FISTULA CLOSURE W/ TAH  2000    Social History   Tobacco Use  . Smoking status: Never Smoker  Substance Use Topics  . Alcohol use: No  . Drug use: No    Family History  Problem Relation Age of Onset  . Asthma Unknown     Allergies  Allergen Reactions  . Penicillins     Swelling, rash, hives all over    Medication list has been reviewed and updated.  Current Outpatient Medications on File Prior to Visit  Medication Sig Dispense Refill  . amLODipine (NORVASC) 5 MG tablet Take 1 tablet (5 mg total) by mouth daily. 90 tablet 1  . Dulaglutide (TRULICITY) 0.75 MG/0.5ML SOPN Use once a week for diabetes 12 pen 3  . losartan-hydrochlorothiazide (HYZAAR) 100-25 MG tablet Take 1 tablet by mouth daily. 90 tablet 1  . metFORMIN (GLUCOPHAGE) 500 MG tablet Take 2 tablets twice daily for blood sugar control 360 tablet 3  . metoprolol succinate (TOPROL-XL) 25 MG  24 hr tablet Take 1 tablet (25 mg total) by mouth daily. 90 tablet 3  . naproxen (NAPROSYN) 500 MG tablet Take 1 tablet (500 mg total) by mouth 2 (two) times daily with a meal. Use as needed for hip pain 60 tablet 1  . potassium chloride SA (K-DUR,KLOR-CON) 20 MEQ tablet Take 1 tablet (20 mEq total) by mouth daily. Take for 5 days ONLY- keep the rest of bottle as may be needed later 30 tablet 0   No current facility-administered medications on file prior to visit.     Review of Systems:  As per HPI- otherwise negative.   Physical Examination: Vitals:   02/03/18 1425  BP: 132/82  Pulse: 97  Temp: 98.4 F (36.9 C)  SpO2: 97%   Vitals:   02/03/18 1425  Weight: 206 lb 3.2 oz (93.5 kg)  Height: 5\' 3"  (1.6 m)   Body mass  index is 36.53 kg/m. Ideal Body Weight: Weight in (lb) to have BMI = 25: 140.8  GEN: WDWN, NAD, Non-toxic, A & O x 3, obese, looks well  HEENT: Atraumatic, Normocephalic. Neck supple. No masses, No LAD.  Bilateral TM wnl, oropharynx normal.  PEERL,EOMI.  Sinuses are tender to percussion Ears and Nose: No external deformity. CV: RRR, No M/G/R. No JVD. No thrill. No extra heart sounds. PULM: CTA B, no wheezes, crackles, rhonchi. No retractions. No resp. distress. No accessory muscle use. ABD: S, NT, ND EXTR: No c/c/e NEURO Normal gait.  PSYCH: Normally interactive. Conversant. Not depressed or anxious appearing.  Calm demeanor.    Assessment and Plan: Acute bronchitis, unspecified organism - Plan: doxycycline (VIBRAMYCIN) 100 MG capsule  Hypokalemia - Plan: Basic metabolic panel  Leukocytosis, unspecified type - Plan: CBC  Controlled type 2 diabetes mellitus with complication, without long-term current use of insulin (HCC) - Plan: Hemoglobin A1c  Cough - Plan: HYDROcodone-homatropine (HYCODAN) 5-1.5 MG/5ML syrup  Treat for bronchitis with doxycycline, hycodan for cough Labs pending as above See patient instructions for more details.     Signed Abbe AmsterdamJessica Copland, MD  Received her labs 2/8-  Message to pt:  Your potassium is slightly low, but not concerning.  This is likely due to your HCTZ medication.  I would suggest that you get a couple of high potassium foods in your diet every day- you can find a good list of high potassium foods online.  We will continue to keep an eye on this. Diabetes is under fine control.  Continue to take metformin only as you are now  Your white blood cell count is minimally high.  This was present back in 2014, and again the three times I have checked over the last year.  As this elevation is minimal and not getting worse, I tend to think it is benign variation.  However, it is also possible that this signifies a low level bone marrow issue.  I  certainly do not think that you have anything serious like leukemia!  Would you like to see hematology to have this evaluated further?  This is an option, or we can continue to monitor and watch for any change.  At this point I would favor just continuing to watch it but wanted to give you the option of seeing hematology.   In any case, please see me in about 6 months to check on your diabetes  Results for orders placed or performed in visit on 02/03/18  Basic metabolic panel  Result Value Ref Range   Sodium 137 135 -  145 mEq/L   Potassium 3.3 (L) 3.5 - 5.1 mEq/L   Chloride 98 96 - 112 mEq/L   CO2 29 19 - 32 mEq/L   Glucose, Bld 163 (H) 70 - 99 mg/dL   BUN 16 6 - 23 mg/dL   Creatinine, Ser 1.61 0.40 - 1.20 mg/dL   Calcium 9.3 8.4 - 09.6 mg/dL   GFR 04.54 >09.81 mL/min  Hemoglobin A1c  Result Value Ref Range   Hgb A1c MFr Bld 6.4 4.6 - 6.5 %   Lab Results  Component Value Date   WBC 10.9 (H) 02/04/2018   HGB 13.4 02/04/2018   HCT 39.7 02/04/2018   MCV 82.4 02/04/2018   PLT 278 02/04/2018

## 2018-02-03 NOTE — Patient Instructions (Signed)
Good to see you today!  It looks like you have bronchitis- we will treat this with doxycycline (antibiotic) and also the cough syrup as needed This cough syrup will make you sleepy however so do not use when you need to drive We can check on your A1c and potassium today as well  Please let me know if you are not feeling better in the next few days- if need be we can try an oral steroid for you, if the cough won't go away

## 2018-02-04 ENCOUNTER — Other Ambulatory Visit: Payer: BLUE CROSS/BLUE SHIELD

## 2018-02-04 ENCOUNTER — Encounter: Payer: Self-pay | Admitting: Family Medicine

## 2018-02-04 DIAGNOSIS — D72829 Elevated white blood cell count, unspecified: Secondary | ICD-10-CM

## 2018-02-04 LAB — CBC WITH DIFFERENTIAL/PLATELET
BASOS PCT: 0.5 %
Basophils Absolute: 55 cells/uL (ref 0–200)
EOS ABS: 327 {cells}/uL (ref 15–500)
Eosinophils Relative: 3 %
HCT: 39.7 % (ref 35.0–45.0)
HEMOGLOBIN: 13.4 g/dL (ref 11.7–15.5)
Lymphs Abs: 3597 cells/uL (ref 850–3900)
MCH: 27.8 pg (ref 27.0–33.0)
MCHC: 33.8 g/dL (ref 32.0–36.0)
MCV: 82.4 fL (ref 80.0–100.0)
MONOS PCT: 6 %
MPV: 10.1 fL (ref 7.5–12.5)
NEUTROS ABS: 6268 {cells}/uL (ref 1500–7800)
Neutrophils Relative %: 57.5 %
PLATELETS: 278 10*3/uL (ref 140–400)
RBC: 4.82 10*6/uL (ref 3.80–5.10)
RDW: 13.8 % (ref 11.0–15.0)
TOTAL LYMPHOCYTE: 33 %
WBC mixed population: 654 cells/uL (ref 200–950)
WBC: 10.9 10*3/uL — AB (ref 3.8–10.8)

## 2018-02-04 LAB — BASIC METABOLIC PANEL
BUN: 16 mg/dL (ref 6–23)
CALCIUM: 9.3 mg/dL (ref 8.4–10.5)
CO2: 29 mEq/L (ref 19–32)
Chloride: 98 mEq/L (ref 96–112)
Creatinine, Ser: 0.79 mg/dL (ref 0.40–1.20)
GFR: 77.42 mL/min (ref >60.00)
GLUCOSE: 163 mg/dL — AB (ref 70–99)
POTASSIUM: 3.3 meq/L — AB (ref 3.5–5.1)
SODIUM: 137 meq/L (ref 135–145)

## 2018-02-04 LAB — HEMOGLOBIN A1C: Hgb A1c MFr Bld: 6.4 % (ref 4.6–6.5)

## 2018-02-05 ENCOUNTER — Encounter: Payer: Self-pay | Admitting: Family Medicine

## 2018-02-05 DIAGNOSIS — J209 Acute bronchitis, unspecified: Secondary | ICD-10-CM

## 2018-02-08 MED ORDER — AZITHROMYCIN 250 MG PO TABS: ORAL_TABLET | ORAL | 0 refills | Status: DC

## 2018-02-08 NOTE — Addendum Note (Signed)
Addended by: Abbe AmsterdamOPLAND, Gustie Bobb C on: 02/08/2018 07:47 AM   Modules accepted: Orders

## 2018-04-14 IMAGING — DX DG RIBS 2V*L*
2 series · 2 of 2 positions shown · non-contrast
Comparison: 07/18/2013

CLINICAL DATA: Left rib pain starting end [DATE], no known
injury

EXAM:
LEFT RIBS - 2 VIEW

[rib ap]
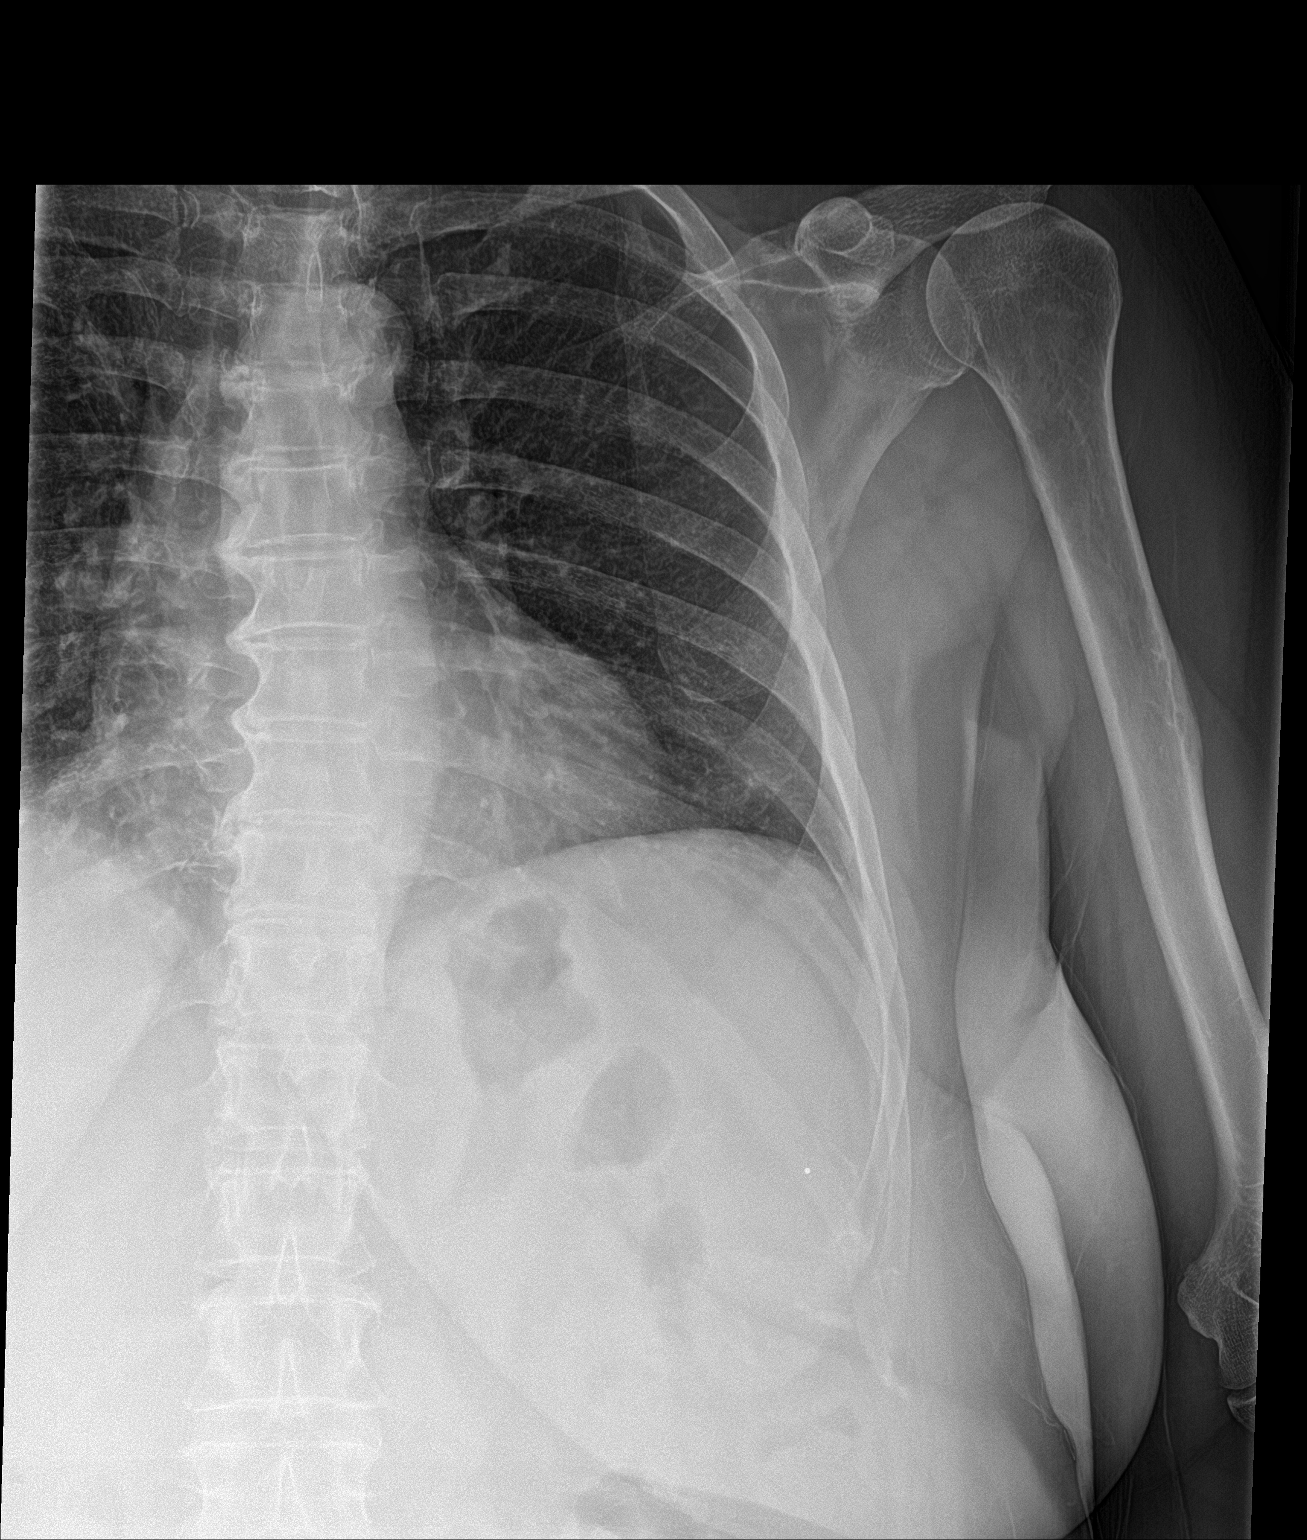

[rib ap obl]
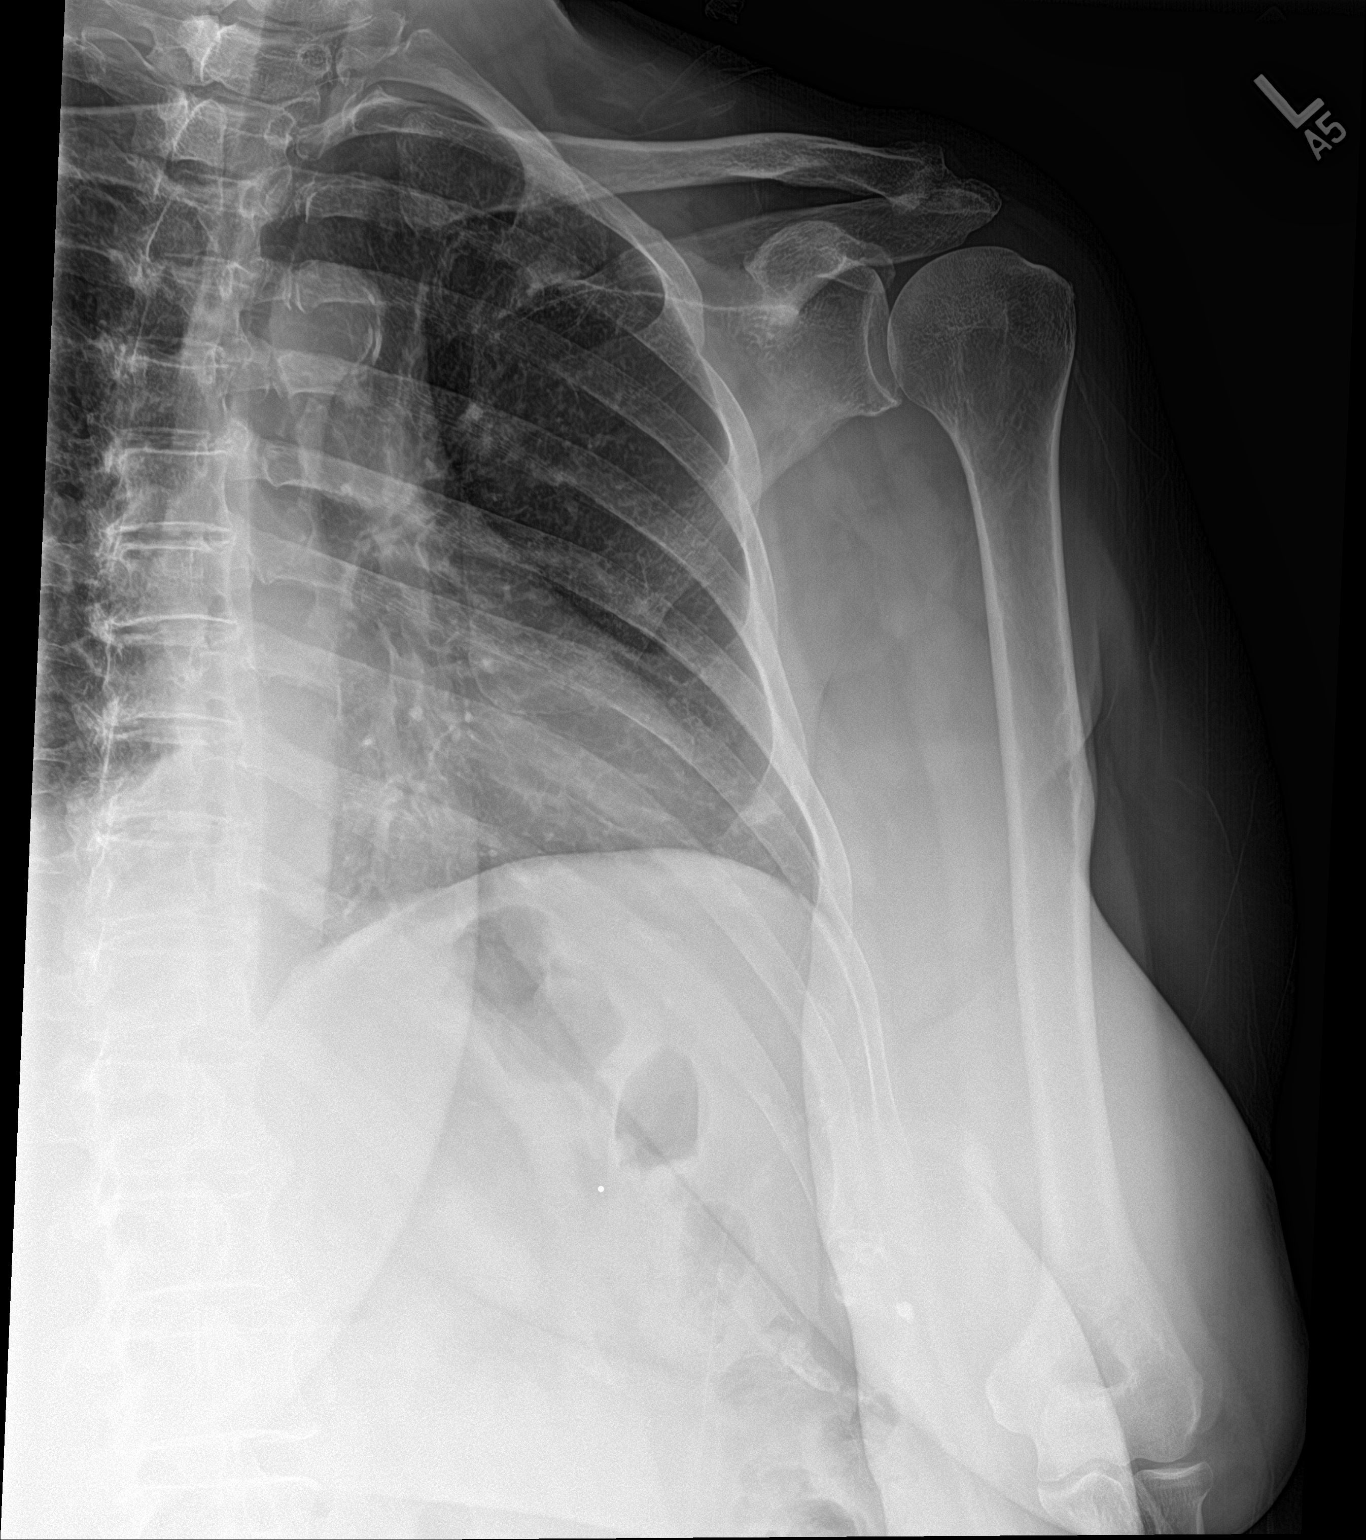

[2 of 2 positions shown; findings below may reference images not displayed]

FINDINGS: Two views left ribs submitted. No left rib fracture is identified.
No pneumothorax.
IMPRESSION: Negative.

## 2018-04-26 ENCOUNTER — Other Ambulatory Visit: Payer: Self-pay | Admitting: Family Medicine

## 2018-04-26 DIAGNOSIS — E119 Type 2 diabetes mellitus without complications: Secondary | ICD-10-CM

## 2018-04-26 DIAGNOSIS — I10 Essential (primary) hypertension: Secondary | ICD-10-CM

## 2018-05-23 NOTE — Progress Notes (Addendum)
[facility] at [facility] [address] [place], [place] [address] [phone]  Date:  05/25/2018   Name:  Anne Christensen   DOB:  [birth year]   MRN:  [id number]  PCP:  Pearline Cables, MD    Chief Complaint: Diabetes (follow up); Thumb Pain (several weeks, right thumb pain, arthritis?); and Hip Pain   History of Present Illness:  Anne Christensen is a 67 y.o. very pleasant female patient who presents with the following:  Following up today History of DM, hyperlipidemia and HTN, fatty liver, asthma  Last seen here in February, lab notes as follows: Your potassium is slightly low, but not concerning.  This is likely due to your HCTZ medication.  I would suggest that you get a couple of high potassium foods in your diet every day- you can find a good list of high potassium foods online.  We will continue to keep an eye on this. Diabetes is under fine control.  Continue to take metformin only as you are now Your white blood cell count is minimally high.  This was present back in 2014, and again the three times I have checked over the last year.  As this elevation is minimal and not getting worse, I tend to think it is benign variation.  However, it is also possible that this signifies a low level bone marrow issue.  I certainly do not think that you have anything serious like leukemia!  Would you like to see hematology to have this evaluated further?  This is an option, or we can continue to monitor and watch for any change.  At this point I would favor just continuing to watch it but wanted to give you the option of seeing hematology.  In any case, please see me in about 6 months to check on your diabetes  Lab Results  Component Value Date   HGBA1C 6.4 02/03/2018   Eye exam: done this year  Tetanus: pt declines   Pt notes that she will have joint pains that move around- she has had pain in her finger, in her elbow- however these other joints  are now better  She has current pain with her right thumb.  She has noted it feeling stiff and painful for about 2 months now She has tried applying a bandage to support her thumb joint No injury that she can think of  She has used tylenol and naproxen prn   Her husband does report that she snores- offered to have her tested for OSA but she declines today  She notes that she is often tired and has difficulty losing weight- encouraged a sleep study but she is not interested She notes that since coming off trulicity she has gained about 10 lbs as below Does not feel that she changed anything otherwise   Wt Readings from Last 3 Encounters:  05/25/18 207 lb 9.6 oz (94.2 kg)  02/03/18 206 lb 3.2 oz (93.5 kg)  10/27/17 198 lb 12.8 oz (90.2 kg)     Patient Active Problem List   Diagnosis Date Noted  . Diabetes mellitus type 2, controlled, without complications (HCC) 03/16/2016  . Epistaxis, recurrent 07/18/2013  . DJD (degenerative joint disease) 10/20/2011  . HYPERCHOLESTEROLEMIA 05/02/2008  . OVERWEIGHT 05/02/2008  . FATTY LIVER DISEASE 05/02/2008  . CHOLELITHIASIS 05/02/2008  . HYPERTENSION 05/01/2008  . ASTHMA 05/01/2008  . BACK PAIN, LUMBAR 05/01/2008  . ALLERGY 05/01/2008    Past Medical  History:  Diagnosis Date  . Acute bronchitis   . Allergy, unspecified not elsewhere classified   . Arthritis   . Asthma   . Bleeding nose   . Cholelithiasis   . Depression   . Diabetes mellitus type 2, controlled, without complications (HCC) 03/16/2016  . Fatty liver   . Hypercholesterolemia   . Hypertension   . Lumbar back pain   . Overweight(278.02)     Past Surgical History:  Procedure Laterality Date  . INCISIONAL HERNIA REPAIR  2001  . VESICOVAGINAL FISTULA CLOSURE W/ TAH  2000    Social History   Tobacco Use  . Smoking status: Never Smoker  Substance Use Topics  . Alcohol use: No  . Drug use: No    Family History  Problem Relation Age of Onset  . Asthma Unknown      Allergies  Allergen Reactions  . Penicillins     Swelling, rash, hives all over    Medication list has been reviewed and updated.  Current Outpatient Medications on File Prior to Visit  Medication Sig Dispense Refill  . amLODipine (NORVASC) 5 MG tablet Take 1 tablet (5 mg total) by mouth daily. 90 tablet 1  . HYDROcodone-homatropine (HYCODAN) 5-1.5 MG/5ML syrup Take 5 mLs by mouth every 8 (eight) hours as needed for cough. 90 mL 0  . losartan-hydrochlorothiazide (HYZAAR) 100-25 MG tablet Take 1 tablet by mouth daily. 90 tablet 1  . metFORMIN (GLUCOPHAGE) 500 MG tablet TAKE 2 TABLETS BY MOUTH TWICE DAILY FOR BLOOD SUGAR 360 tablet 0  . metoprolol succinate (TOPROL-XL) 25 MG 24 hr tablet TAKE 1 TABLET(25 MG) BY MOUTH DAILY 90 tablet 0  . naproxen (NAPROSYN) 500 MG tablet Take 1 tablet (500 mg total) by mouth 2 (two) times daily with a meal. Use as needed for hip pain 60 tablet 1  . potassium chloride SA (K-DUR,KLOR-CON) 20 MEQ tablet Take 1 tablet (20 mEq total) by mouth daily. Take for 5 days ONLY- keep the rest of bottle as may be needed later 30 tablet 0   No current facility-administered medications on file prior to visit.     Review of Systems:  As per HPI- otherwise negative. No fever or chills No redness, swelling or heat of painful joints to suggest gout   Physical Examination: Vitals:   05/25/18 1449  BP: 134/80  Pulse: 90  Resp: 16  SpO2: 98%   Vitals:   05/25/18 1449  Weight: 207 lb 9.6 oz (94.2 kg)  Height:  (1.6 m)   Body mass index is 36.77 kg/m. Ideal Body Weight: Weight in (lb) to have BMI = 25: 140.8  GEN: WDWN, NAD, Non-toxic, A & O x 3, obese, looks well  HEENT: Atraumatic, Normocephalic. Neck supple. No masses, No LAD.  Bilateral TM wnl, oropharynx normal.  PEERL,EOMI.   Ears and Nose: No external deformity. CV: RRR, No M/G/R. No JVD. No thrill. No extra heart sounds. PULM: CTA B, no wheezes, crackles, rhonchi. No retractions. No resp.  distress. No accessory muscle use. EXTR: No c/c/e NEURO Normal gait.  PSYCH: Normally interactive. Conversant. Not depressed or anxious appearing.  Calm demeanor.  Right thumb: she has tenderness to movement at the IP joint    Assessment and Plan: Hypokalemia - Plan: Comprehensive metabolic panel  Controlled type 2 diabetes mellitus with complication, without long-term current use of insulin (HCC) - Plan: Hemoglobin A1c  Essential hypertension  Hyperlipidemia associated with type 2 diabetes mellitus (HCC)  Leukocytosis, unspecified type -  Plan: CBC  Arthralgia, unspecified joint - Plan: Sedimentation rate, Cyclic Citrul Peptide Antibody, IGG, Rheumatoid Factor, C-reactive protein, Uric Acid  Pain in thumb joint with movement of right hand - Plan: meloxicam (MOBIC) 15 MG tablet  Following up today Labs pending as above Consider going back on trulicity if her A1c is up again mobic to use prn for her thumb pain Labs to see if RA/ gout more likely but suspect OA BP controlled   Signed Abbe Amsterdam, MD   Received her labs 5/31, message to pt Your white blood cell count is minimally high, stable from your last several checks.  Again, this increase is so minimal that I suspect it is harmless and we can continue to follow periodically.  However we can certainly do more testing or request a hematology consultation if you like A1c continues to be in the pre-diabetes range and shows good control of your diabetes Your potassium is a bit low again- I am going to rx a low dose of a potassium supplement- 10 meq- for you to take daily.  You can continue to take the potassium as long as you are on the HCTZ for your blood pressure Your labs do not suggest rheumatoid arthritis or gout.  How is the medication working for your thumb? Please see me in 4-6 months for a recheck  Results for orders placed or performed in visit on 05/25/18  CBC  Result Value Ref Range   WBC 10.7 (H) 4.0 - 10.5  K/uL   RBC 4.85 3.87 - 5.11 Mil/uL   Platelets 257.0 150.0 - 400.0 K/uL   Hemoglobin 13.7 12.0 - 15.0 g/dL   HCT 16.1 09.6 - 04.5 %   MCV 83.9 78.0 - 100.0 fl   MCHC 33.8 30.0 - 36.0 g/dL   RDW 40.9 81.1 - 91.4 %  Hemoglobin A1c  Result Value Ref Range   Hgb A1c MFr Bld 6.3 4.6 - 6.5 %  Comprehensive metabolic panel  Result Value Ref Range   Sodium 140 135 - 145 mEq/L   Potassium 3.1 (L) 3.5 - 5.1 mEq/L   Chloride 101 96 - 112 mEq/L   CO2 28 19 - 32 mEq/L   Glucose, Bld 158 (H) 70 - 99 mg/dL   BUN 17 6 - 23 mg/dL   Creatinine, Ser 7.82 0.40 - 1.20 mg/dL   Total Bilirubin 0.4 0.2 - 1.2 mg/dL   Alkaline Phosphatase 41 39 - 117 U/L   AST 16 0 - 37 U/L   ALT 22 0 - 35 U/L   Total Protein 7.2 6.0 - 8.3 g/dL   Albumin 4.1 3.5 - 5.2 g/dL   Calcium 9.4 8.4 - 95.6 mg/dL   GFR 21.30 >86.57 mL/min  Sedimentation rate  Result Value Ref Range   Sed Rate 17 0 - 30 mm/hr  Cyclic Citrul Peptide Antibody, IGG  Result Value Ref Range   Cyclic Citrullin Peptide Ab <84 UNITS  Rheumatoid Factor  Result Value Ref Range   Rhuematoid fact SerPl-aCnc <14 <14 IU/mL  C-reactive protein  Result Value Ref Range   CRP 1.0 0.5 - 20.0 mg/dL  Uric Acid  Result Value Ref Range   Uric Acid, Serum 5.3 2.4 - 7.0 mg/dL

## 2018-05-25 ENCOUNTER — Ambulatory Visit: Payer: BLUE CROSS/BLUE SHIELD | Admitting: Family Medicine

## 2018-05-25 ENCOUNTER — Encounter: Payer: Self-pay | Admitting: Family Medicine

## 2018-05-25 VITALS — BP 134/80 | HR 90 | Resp 16 | Ht 63.0 in | Wt 207.6 lb

## 2018-05-25 DIAGNOSIS — E1169 Type 2 diabetes mellitus with other specified complication: Secondary | ICD-10-CM

## 2018-05-25 DIAGNOSIS — D72829 Elevated white blood cell count, unspecified: Secondary | ICD-10-CM

## 2018-05-25 DIAGNOSIS — E118 Type 2 diabetes mellitus with unspecified complications: Secondary | ICD-10-CM

## 2018-05-25 DIAGNOSIS — I10 Essential (primary) hypertension: Secondary | ICD-10-CM

## 2018-05-25 DIAGNOSIS — E876 Hypokalemia: Secondary | ICD-10-CM | POA: Diagnosis not present

## 2018-05-25 DIAGNOSIS — E785 Hyperlipidemia, unspecified: Secondary | ICD-10-CM

## 2018-05-25 DIAGNOSIS — M25541 Pain in joints of right hand: Secondary | ICD-10-CM

## 2018-05-25 DIAGNOSIS — M2550 Pain in unspecified joint: Secondary | ICD-10-CM | POA: Diagnosis not present

## 2018-05-25 MED ORDER — MELOXICAM 15 MG PO TABS: 15.0000 mg | ORAL_TABLET | Freq: Every day | ORAL | 0 refills | Status: DC

## 2018-05-25 NOTE — Patient Instructions (Addendum)
It was great to see you today- take care and I will be in touch with your labs asap I would suggest that you 1. Get a tetanus booster 2. Be tested for sleep apnea 3. Get the shingrix shingles vaccines when you can!   Please try the meloxicam as needed for joint pains- if taking this don't take ibuprofen or aleve however   I will be in touch with your labs  Please let me know how your thumb does with the medication

## 2018-05-26 LAB — COMPREHENSIVE METABOLIC PANEL
ALBUMIN: 4.1 g/dL (ref 3.5–5.2)
ALK PHOS: 41 U/L (ref 39–117)
ALT: 22 U/L (ref 0–35)
AST: 16 U/L (ref 0–37)
BILIRUBIN TOTAL: 0.4 mg/dL (ref 0.2–1.2)
BUN: 17 mg/dL (ref 6–23)
CALCIUM: 9.4 mg/dL (ref 8.4–10.5)
CO2: 28 mEq/L (ref 19–32)
CREATININE: 0.7 mg/dL (ref 0.40–1.20)
Chloride: 101 mEq/L (ref 96–112)
GFR: 88.93 mL/min (ref >60.00)
Glucose, Bld: 158 mg/dL — ABNORMAL HIGH (ref 70–99)
Potassium: 3.1 mEq/L — ABNORMAL LOW (ref 3.5–5.1)
SODIUM: 140 meq/L (ref 135–145)
TOTAL PROTEIN: 7.2 g/dL (ref 6.0–8.3)

## 2018-05-26 LAB — SEDIMENTATION RATE: SED RATE: 17 mm/h (ref 0–30)

## 2018-05-26 LAB — HEMOGLOBIN A1C: Hgb A1c MFr Bld: 6.3 % (ref 4.6–6.5)

## 2018-05-26 LAB — C-REACTIVE PROTEIN: CRP: 1 mg/dL (ref 0.5–20.0)

## 2018-05-26 LAB — CBC
HCT: 40.6 % (ref 36.0–46.0)
HEMOGLOBIN: 13.7 g/dL (ref 12.0–15.0)
MCHC: 33.8 g/dL (ref 30.0–36.0)
MCV: 83.9 fl (ref 78.0–100.0)
PLATELETS: 257 10*3/uL (ref 150.0–400.0)
RBC: 4.85 Mil/uL (ref 3.87–5.11)
RDW: 14.6 % (ref 11.5–15.5)
WBC: 10.7 10*3/uL — ABNORMAL HIGH (ref 4.0–10.5)

## 2018-05-26 LAB — CYCLIC CITRUL PEPTIDE ANTIBODY, IGG: Cyclic Citrullin Peptide Ab: <16 UNITS

## 2018-05-26 LAB — RHEUMATOID FACTOR: Rhuematoid fact SerPl-aCnc: <14 IU/mL (ref <14)

## 2018-05-26 LAB — URIC ACID: Uric Acid, Serum: 5.3 mg/dL (ref 2.4–7.0)

## 2018-05-27 ENCOUNTER — Encounter: Payer: Self-pay | Admitting: Family Medicine

## 2018-05-27 MED ORDER — POTASSIUM CHLORIDE ER 10 MEQ PO TBCR: 10.0000 meq | EXTENDED_RELEASE_TABLET | Freq: Every day | ORAL | 5 refills | Status: DC

## 2018-05-27 NOTE — Addendum Note (Signed)
Addended by: Abbe Amsterdam C on: 05/27/2018 05:26 PM   Modules accepted: Orders

## 2018-05-28 MED ORDER — POTASSIUM CHLORIDE ER 10 MEQ PO CPCR: 10.0000 meq | ORAL_CAPSULE | Freq: Every day | ORAL | 1 refills | Status: DC

## 2018-06-21 ENCOUNTER — Other Ambulatory Visit: Payer: Self-pay | Admitting: Family Medicine

## 2018-06-21 DIAGNOSIS — I10 Essential (primary) hypertension: Secondary | ICD-10-CM

## 2018-06-21 DIAGNOSIS — M25541 Pain in joints of right hand: Secondary | ICD-10-CM

## 2018-07-22 ENCOUNTER — Ambulatory Visit: Payer: Self-pay | Admitting: Family Medicine

## 2018-07-22 NOTE — Telephone Encounter (Signed)
Pt with symptoms of feeling "unbalanced" and the room spinning. Pt stated that she she is resting and not walking or moving her head she feels better. Pt stated symptoms started last Wednesday. Pt stated that she has a "popping" sound or "pressure" to ear but that it does not really ache. Pt stated the dizziness/vertigo is mild. Pt has had similar symptoms 1 year ago but it went away on its own without treatment.  Pt stated she vomited last night.  Pt advised to continue to try and take in fluids and to rest and not to drive. Pt advised to sit on the side of the bed or a chair for a few minutes before standing and to avoid sudden head movements.  Care advice given per protocol. Appt given for Monday (no availability today and pt not agreeable to going to another [facility] practice).   Reason for Disposition . Earache  Answer Assessment - Initial Assessment Questions 1. DESCRIPTION: "Describe your dizziness."     Everything waving around, unbalanced 2. VERTIGO: "Do you feel like either you or the room is spinning or tilting?"      yes 3. LIGHTHEADED: "Do you feel lightheaded?" (e.g., somewhat faint, woozy, weak upon standing)     yes 4. SEVERITY: "How bad is it?"  "Can you walk?"   - MILD - Feels unsteady but walking normally.   - MODERATE - Feels very unsteady when walking, but not falling; interferes with normal activities (e.g., school, work) .   - SEVERE - Unable to walk without falling (requires assistance).     mild 5. ONSET:  "When did the dizziness begin?"     Last Wednesday 6. AGGRAVATING FACTORS: "Does anything make it worse?" (e.g., standing, change in head position)     Standing, changing in head position 7. CAUSE: "What do you think is causing the dizziness?"     Ear infection or something going on with right air 8. RECURRENT SYMPTOM: "Have you had dizziness before?" If so, ask: "When was the last time?" "What happened that time?"     Yes-1 year ago- went away on its own 469.  OTHER SYMPTOMS: "Do you have any other symptoms?" (e.g., headache, weakness, numbness, vomiting, earache)     Vomited last night,earache (feels pressure)to the right ear 10. PREGNANCY: "Is there any chance you are pregnant?" "When was your last menstrual period?"       n/a  Protocols used: DIZZINESS - VERTIGO-A-AH

## 2018-07-25 ENCOUNTER — Encounter: Payer: Self-pay | Admitting: Family Medicine

## 2018-07-25 ENCOUNTER — Ambulatory Visit: Payer: BLUE CROSS/BLUE SHIELD | Admitting: Family Medicine

## 2018-07-25 ENCOUNTER — Other Ambulatory Visit: Payer: Self-pay | Admitting: Family Medicine

## 2018-07-25 VITALS — BP 162/94 | HR 110 | Temp 98.6°F | Ht 63.0 in | Wt 206.8 lb

## 2018-07-25 DIAGNOSIS — I10 Essential (primary) hypertension: Secondary | ICD-10-CM

## 2018-07-25 DIAGNOSIS — R42 Dizziness and giddiness: Secondary | ICD-10-CM | POA: Diagnosis not present

## 2018-07-25 DIAGNOSIS — E119 Type 2 diabetes mellitus without complications: Secondary | ICD-10-CM

## 2018-07-25 DIAGNOSIS — H6501 Acute serous otitis media, right ear: Secondary | ICD-10-CM

## 2018-07-25 LAB — CBC WITH DIFFERENTIAL/PLATELET
BASOS PCT: 0.5 % (ref 0.0–3.0)
Basophils Absolute: 0 10*3/uL (ref 0.0–0.1)
Eosinophils Absolute: 0.1 10*3/uL (ref 0.0–0.7)
Eosinophils Relative: 1.1 % (ref 0.0–5.0)
HEMATOCRIT: 41.4 % (ref 36.0–46.0)
HEMOGLOBIN: 14 g/dL (ref 12.0–15.0)
LYMPHS PCT: 26.2 % (ref 12.0–46.0)
Lymphs Abs: 2.4 10*3/uL (ref 0.7–4.0)
MCHC: 33.7 g/dL (ref 30.0–36.0)
MCV: 84 fl (ref 78.0–100.0)
MONOS PCT: 6.3 % (ref 3.0–12.0)
Monocytes Absolute: 0.6 10*3/uL (ref 0.1–1.0)
NEUTROS ABS: 6.1 10*3/uL (ref 1.4–7.7)
Neutrophils Relative %: 65.9 % (ref 43.0–77.0)
PLATELETS: 258 10*3/uL (ref 150.0–400.0)
RBC: 4.93 Mil/uL (ref 3.87–5.11)
RDW: 15.4 % (ref 11.5–15.5)
WBC: 9.2 10*3/uL (ref 4.0–10.5)

## 2018-07-25 LAB — TSH: TSH: 2.28 u[IU]/mL (ref 0.35–4.50)

## 2018-07-25 LAB — COMPREHENSIVE METABOLIC PANEL
ALT: 27 U/L (ref 0–35)
AST: 18 U/L (ref 0–37)
Albumin: 4.1 g/dL (ref 3.5–5.2)
Alkaline Phosphatase: 43 U/L (ref 39–117)
BILIRUBIN TOTAL: 0.5 mg/dL (ref 0.2–1.2)
BUN: 18 mg/dL (ref 6–23)
CO2: 27 mEq/L (ref 19–32)
Calcium: 9.9 mg/dL (ref 8.4–10.5)
Chloride: 103 mEq/L (ref 96–112)
Creatinine, Ser: 0.74 mg/dL (ref 0.40–1.20)
GFR: 83.36 mL/min (ref >60.00)
Glucose, Bld: 137 mg/dL — ABNORMAL HIGH (ref 70–99)
Potassium: 3.4 mEq/L — ABNORMAL LOW (ref 3.5–5.1)
SODIUM: 141 meq/L (ref 135–145)
TOTAL PROTEIN: 7.3 g/dL (ref 6.0–8.3)

## 2018-07-25 LAB — VITAMIN B12: Vitamin B-12: 513 pg/mL (ref 211–911)

## 2018-07-25 MED ORDER — LORATADINE 10 MG PO TABS: 10.0000 mg | ORAL_TABLET | Freq: Every day | ORAL | 11 refills | Status: DC

## 2018-07-25 MED ORDER — FLUTICASONE PROPIONATE 50 MCG/ACT NA SUSP: 2.0000 | Freq: Every day | NASAL | 6 refills | Status: DC

## 2018-07-25 MED ORDER — MECLIZINE HCL 25 MG PO TABS: 25.0000 mg | ORAL_TABLET | Freq: Three times a day (TID) | ORAL | 0 refills | Status: DC | PRN

## 2018-07-25 MED ORDER — METOPROLOL SUCCINATE ER 50 MG PO TB24: 50.0000 mg | ORAL_TABLET | Freq: Every day | ORAL | 3 refills | Status: DC

## 2018-07-25 NOTE — Assessment & Plan Note (Signed)
Poorly controlled will alter medications, encouraged DASH diet, minimize caffeine and obtain adequate sleep. Report concerning symptoms and follow up as directed and as needed 

## 2018-07-25 NOTE — Patient Instructions (Signed)
Dizziness °Dizziness is a common problem. It is a feeling of unsteadiness or light-headedness. You may feel like you are about to faint. Dizziness can lead to injury if you stumble or fall. Anyone can become dizzy, but dizziness is more common in older adults. This condition can be caused by a number of things, including medicines, dehydration, or illness. °Follow these instructions at home: °Eating and drinking °· Drink enough fluid to keep your urine clear or pale yellow. This helps to keep you from becoming dehydrated. Try to drink more clear fluids, such as water. °· Do not drink alcohol. °· Limit your caffeine intake if told to do so by your health care provider. Check ingredients and nutrition facts to see if a food or beverage contains caffeine. °· Limit your salt (sodium) intake if told to do so by your health care provider. Check ingredients and nutrition facts to see if a food or beverage contains sodium. °Activity °· Avoid making quick movements. °? Rise slowly from chairs and steady yourself until you feel okay. °? In the morning, first sit up on the side of the bed. When you feel okay, stand slowly while you hold onto something until you know that your balance is fine. °· If you need to stand in one place for a long time, move your legs often. Tighten and relax the muscles in your legs while you are standing. °· Do not drive or use heavy machinery if you feel dizzy. °· Avoid bending down if you feel dizzy. Place items in your home so that they are easy for you to reach without leaning over. °Lifestyle °· Do not use any products that contain nicotine or tobacco, such as cigarettes and e-cigarettes. If you need help quitting, ask your health care provider. °· Try to reduce your stress level by using methods such as yoga or meditation. Talk with your health care provider if you need help to manage your stress. °General instructions °· Watch your dizziness for any changes. °· Take over-the-counter and  prescription medicines only as told by your health care provider. Talk with your health care provider if you think that your dizziness is caused by a medicine that you are taking. °· Tell a friend or a family member that you are feeling dizzy. If he or she notices any changes in your behavior, have this person call your health care provider. °· Keep all follow-up visits as told by your health care provider. This is important. °Contact a health care provider if: °· Your dizziness does not go away. °· Your dizziness or light-headedness gets worse. °· You feel nauseous. °· You have reduced hearing. °· You have new symptoms. °· You are unsteady on your feet or you feel like the room is spinning. °Get help right away if: °· You vomit or have diarrhea and are unable to eat or drink anything. °· You have problems talking, walking, swallowing, or using your arms, hands, or legs. °· You feel generally weak. °· You are not thinking clearly or you have trouble forming sentences. It may take a friend or family member to notice this. °· You have chest pain, abdominal pain, shortness of breath, or sweating. °· Your vision changes. °· You have any bleeding. °· You have a severe headache. °· You have neck pain or a stiff neck. °· You have a fever. °These symptoms may represent a serious problem that is an emergency. Do not wait to see if the symptoms will go away. Get medical help   right away. Call your local emergency services (911 in the [place] Do not drive yourself to the hospital. °Summary °· Dizziness is a feeling of unsteadiness or light-headedness. This condition can be caused by a number of things, including medicines, dehydration, or illness. °· Anyone can become dizzy, but dizziness is more common in older adults. °· Drink enough fluid to keep your urine clear or pale yellow. Do not drink alcohol. °· Avoid making quick movements if you feel dizzy. Monitor your dizziness for any changes. °This information is not intended to  replace advice given to you by your health care provider. Make sure you discuss any questions you have with your health care provider. °Document Released: 06/09/2001 Document Revised: 01/16/2017 Document Reviewed: 01/16/2017 °Elsevier Interactive Patient Education © 2018 Elsevier Inc. ° °

## 2018-07-25 NOTE — Progress Notes (Signed)
Patient ID: Anne Christensen, female    DOB: [birth year]  Age: 67 y.o. MRN: [id number]    Subjective:  Subjective  HPI [name] presents for dizziness x few weeks and ear pressure-- mostly R ear.  No cp , no palpitations.  + nausea and vomiting x1.  + headaches  No other complaints.  She has been taking all her meds as rx.   Review of Systems  Constitutional: Negative for fever.  HENT: Positive for ear pain. Negative for congestion, ear discharge, postnasal drip, rhinorrhea, sinus pressure and sinus pain.   Respiratory: Negative for shortness of breath.   Cardiovascular: Negative for chest pain, palpitations and leg swelling.  Gastrointestinal: Positive for nausea and vomiting. Negative for abdominal pain and blood in stool.  Genitourinary: Negative for dysuria and frequency.  Skin: Negative for rash.  Allergic/Immunologic: Negative for environmental allergies.  Neurological: Positive for dizziness and headaches.  Psychiatric/Behavioral: The patient is not nervous/anxious.     History Past Medical History:  Diagnosis Date  . Acute bronchitis   . Allergy, unspecified not elsewhere classified   . Arthritis   . Asthma   . Bleeding nose   . Cholelithiasis   . Depression   . Diabetes mellitus type 2, controlled, without complications (HCC) 03/16/2016  . Fatty liver   . Hypercholesterolemia   . Hypertension   . Lumbar back pain   . Overweight(278.02)     She has a past surgical history that includes Vesicovaginal fistula closure w/ TAH (2000) and Incisional hernia repair (2001).   Her family history includes Asthma in her unknown relative.She reports that she has never smoked. She does not have any smokeless tobacco history on file. She reports that she does not drink alcohol or use drugs.  Current Outpatient Medications on File Prior to Visit  Medication Sig Dispense Refill  . amLODipine (NORVASC) 5 MG tablet TAKE 1 TABLET(5 MG) BY MOUTH DAILY 90 tablet 1  .  losartan-hydrochlorothiazide (HYZAAR) 100-25 MG tablet TAKE 1 TABLET BY MOUTH DAILY 90 tablet 1  . potassium chloride (MICRO-K) 10 MEQ CR capsule Take 1 capsule (10 mEq total) by mouth daily. 90 capsule 1   No current facility-administered medications on file prior to visit.      Objective:  Objective  Physical Exam  Constitutional: She is oriented to person, place, and time. She appears well-developed and well-nourished.  HENT:  Head: Normocephalic and atraumatic.  Right Ear: Tympanic membrane is not injected, not scarred, not perforated, not erythematous, not retracted and not bulging. A middle ear effusion is present. Decreased hearing is noted.  Left Ear: Hearing, tympanic membrane, external ear and ear canal normal.  No middle ear effusion. No decreased hearing is noted.  Nose: Right sinus exhibits maxillary sinus tenderness and frontal sinus tenderness. Left sinus exhibits maxillary sinus tenderness and frontal sinus tenderness.  Eyes: Conjunctivae and EOM are normal.  Neck: Normal range of motion. Neck supple. No JVD present. Carotid bruit is not present. No thyromegaly present.  Cardiovascular: Normal rate, regular rhythm and normal heart sounds.  No murmur heard. Pulmonary/Chest: Effort normal and breath sounds normal. No respiratory distress. She has no wheezes. She has no rales. She exhibits no tenderness.  Musculoskeletal: She exhibits no edema.  Neurological: She is alert and oriented to person, place, and time.  Psychiatric: She has a normal mood and affect.  Nursing note and vitals reviewed.  BP (!) 162/94 (BP Location: Left Arm, Patient Position: Sitting, Cuff Size: Large)   Pulse Marland Kitchen)  110   Temp 98.6 F (37 C) (Oral)   Ht 5\' 3"  (1.6 m)   Wt 206 lb 12.8 oz (93.8 kg)  SpO2 97%   BMI 36.63 kg/m  Wt Readings from Last 3 Encounters:  07/25/18 206 lb 12.8 oz (93.8 kg)  05/25/18 207 lb 9.6 oz (94.2 kg)  02/03/18 206 lb 3.2 oz (93.5 kg)     Lab Results  Component  Value Date   WBC 10.7 (H) 05/25/2018   HGB 13.7 05/25/2018   HCT 40.6 05/25/2018   PLT 257.0 05/25/2018   GLUCOSE 158 (H) 05/25/2018   CHOL 165 10/27/2017   TRIG 136.0 10/27/2017   HDL 51.20 10/27/2017   LDLDIRECT 149.0 07/18/2013   LDLCALC 87 10/27/2017   ALT 22 05/25/2018   AST 16 05/25/2018   NA 140 05/25/2018   K 3.1 (L) 05/25/2018   CL 101 05/25/2018   CREATININE 0.70 05/25/2018   BUN 17 05/25/2018   CO2 28 05/25/2018   TSH 2.98 07/18/2013   HGBA1C 6.3 05/25/2018    Dg Bone Density  Result Date: 10/27/2017 EXAM: DUAL X-RAY ABSORPTIOMETRY (DXA) FOR BONE MINERAL DENSITY IMPRESSION: Referring Physician:  Pearline CablesJESSICA C COPLAND PATIENT: Name: Adalberto IllWright, Maury Patient ID: [id number] Birth Date: 1951-04-10 Height: 62.0 in. Sex: Female Measured: 10/27/2017 Weight: 196.0 lbs. Indications: Advanced Age, Caucasian, Diabetic, Estrogen Deficiency, Hysterectomy, Oophorectomy(Unilateral), Post Menopausal, Vitamin D Deficiency Fractures: Treatments: ASSESSMENT: The BMD measured at Femur Neck Left is 0.847 g/cm2 with a T-score of -1.4. This patient is considered osteopenic according to World Health Organization [facility](WHO) criteria. L-1 was excluded due to degenerative changes. Site Region Measured Date Measured Age WHO YA BMD Classification T-score AP Spine L2-L4 10/27/2017 65.6 years Normal -0.9 1.097 g/cm2 DualFemur Neck Left 10/27/2017 65.6 years Osteopenia -1.4 0.847 g/cm2 World Health Organization [facility](WHO) criteria for post-menopausal, Caucasian Women: Normal       T-score at or above -1 SD Osteopenia   T-score between -1 and -2.5 SD Osteoporosis T-score at or below -2.5 SD RECOMMENDATION: National Osteoporosis Foundation recommends that FDA-approved medical therapies be considered in postmenopausal women and men age 66 or older with a: 1. Hip or vertebral (clinical or morphometric) fracture. 2. T-score of < -2.5 at the spine or hip. 3. Ten-year fracture probability by FRAX of 3% or greater for hip fracture or 20%  or greater for major osteoporotic fracture. All treatment decisions require clinical judgment and consideration of individual patient factors, including patient preferences, co-morbidities, previous drug use, risk factors not captured in the FRAX model (e.g. falls, vitamin D deficiency, increased bone turnover, interval significant decline in bone density) and possible under - or over-estimation of fracture risk by FRAX. All patients should ensure an adequate intake of dietary calcium (1200 mg/d) and vitamin D (800 IU daily) unless contraindicated. FOLLOW-UP: People with diagnosed cases of osteoporosis or at high risk for fracture should have regular bone mineral density tests. For patients eligible for Medicare, routine testing is allowed once every 2 years. The testing frequency can be increased to one year for patients who have rapidly progressing disease, those who are receiving or discontinuing medical therapy to restore bone mass, or have additional risk factors. I have reviewed this report and agree with the above findings. [facility] Patient: Adalberto IllWright, Anistyn   Referring Physician: Pearline CablesJESSICA C COPLAND Birth Date: 1951-04-10 [age]:       [age] Patient ID: [id number] Height: 62.0 in. Weight: 196.0 lbs. Measured: 10/27/2017 10:09:48 AM (16 SP 2) Sex: Female Ethnicity: White  Analyzed: 10/27/2017 10:10:52 AM (16 SP 2) FRAX* 10-year Probability of Fracture Based on femoral neck BMD: DualFemur (Left) Major Osteoporotic Fracture: 8.0% Hip Fracture:                0.8% Population:                  [place] (Caucasian) Risk Factors:                None *FRAX is a Armed forces logistics/support/administrative officer of the [employer] of [employer] for Metabolic Bone Disease, a World Science writer (WHO) [employer]. ASSESSMENT: The probability of a major osteoporotic fracture is 8.0% within the next ten years. The probability of a hip fracture is 0.8% within the next ten years. Electronically Signed   By: Charlett Nose M.D.    On: 10/27/2017 10:47   Mm Screening Breast Tomo Bilateral  Result Date: 10/27/2017 CLINICAL DATA:  Screening. EXAM: 2D DIGITAL SCREENING BILATERAL MAMMOGRAM WITH CAD AND ADJUNCT TOMO COMPARISON:  Previous exam(s). ACR Breast Density Category b: There are scattered areas of fibroglandular density. FINDINGS: There are no findings suspicious for malignancy. Images were processed with CAD. IMPRESSION: No mammographic evidence of malignancy. A result letter of this screening mammogram will be mailed directly to the patient. RECOMMENDATION: Screening mammogram in one year. (Code:SM-B-01Y) BI-RADS CATEGORY  1: Negative. Electronically Signed   By: Baird Lyons M.D.   On: 10/27/2017 16:10   Dg Hip Unilat W Or W/o Pelvis 2-3 Views Left  Result Date: 10/27/2017 CLINICAL DATA:  Chronic pain EXAM: DG HIP (WITH OR WITHOUT PELVIS) 2-3V LEFT COMPARISON:  None. FINDINGS: Frontal pelvis as well as frontal and lateral left hip images were obtained. There is no evident fracture or dislocation. There is mild symmetric narrowing of both hip joints. There is an apparent bone island in the femoral neck region on the left. Calcification lateral to the iliac crest on the left is likely due to myositis ossificans. Sacroiliac joints appear normal bilaterally. IMPRESSION: Mild symmetric narrowing of both hip joints. Mild myositis ossificans in the left lateral buttocks region. No fracture or dislocation. Electronically Signed   By: Bretta Bang III M.D.   On: 10/27/2017 11:14     Assessment & Plan:  Plan  I have discontinued Bonita Quin Caso's potassium chloride SA, naproxen, HYDROcodone-homatropine, meloxicam, and metoprolol succinate. I am also having her start on metoprolol succinate, fluticasone, loratadine, and meclizine. Additionally, I am having her maintain her potassium chloride, losartan-hydrochlorothiazide, amLODipine, and metFORMIN.  Meds ordered this encounter  Medications  . metoprolol succinate (TOPROL-XL) 50  MG 24 hr tablet    Sig: Take 1 tablet (50 mg total) by mouth daily. Take with or immediately following a meal.    Dispense:  30 tablet    Refill:  3  . fluticasone (FLONASE) 50 MCG/ACT nasal spray    Sig: Place 2 sprays into both nostrils daily.    Dispense:  16 g    Refill:  6  . loratadine (CLARITIN) 10 MG tablet    Sig: Take 1 tablet (10 mg total) by mouth daily.    Dispense:  30 tablet    Refill:  11  . meclizine (ANTIVERT) 25 MG tablet    Sig: Take 1 tablet (25 mg total) by mouth 3 (three) times daily as needed for dizziness.    Dispense:  30 tablet    Refill:  0    Problem List Items Addressed This Visit      Unprioritized  Essential hypertension    Poorly controlled will alter medications, encouraged DASH diet, minimize caffeine and obtain adequate sleep. Report concerning symptoms and follow up as directed and as needed      Relevant Medications   metoprolol succinate (TOPROL-XL) 50 MG 24 hr tablet   Other Relevant Orders   CBC with Differential/Platelet   TSH   Vitamin B12   Comprehensive metabolic panel    Other Visit Diagnoses    Dizziness    -  Primary   Relevant Medications   fluticasone (FLONASE) 50 MCG/ACT nasal spray   loratadine (CLARITIN) 10 MG tablet   meclizine (ANTIVERT) 25 MG tablet   Other Relevant Orders   EKG 12-Lead (Completed)   CBC with Differential/Platelet   TSH   Vitamin B12   Comprehensive metabolic panel   Right acute serous otitis media, recurrence not specified       Relevant Medications   fluticasone (FLONASE) 50 MCG/ACT nasal spray   loratadine (CLARITIN) 10 MG tablet   meclizine (ANTIVERT) 25 MG tablet      Follow-up: Return in about 2 weeks (around 08/08/2018), or if symptoms worsen or fail to improve, for bp check .  Donato Schultz, DO

## 2018-11-08 NOTE — Progress Notes (Addendum)
[facility] [address] [place], [place] [address] [phone]  Date:  11/09/2018   Name:  Anne Christensen   DOB:  [birth year]   MRN:  [id number]  PCP:  Pearline Cables, MD    Chief Complaint: Diabetes (follow up, never had flu shot-unsure ); Hypertension (saw lowne in july, was told to incruse metoprolol to 50mg -pt stuck with 25mg ); and Hyperlipidemia   History of Present Illness:  Anne Christensen is a 67 y.o. very pleasant female patient who presents with the following:  6 month follow-up today History of HTN, DM, hyperlipidmia, overweight I last saw her in May, but she did come in during July for a sick with with Dr. Laury Axon From my May lab notes:  Your white blood cell count is minimally high, stable from your last several checks.  Again, this increase is so minimal that I suspect it is harmless and we can continue to follow periodically.  However we can certainly do more testing or request a hematology consultation if you like A1c continues to be in the pre-diabetes range and shows good control of your diabetes Your potassium is a bit low again- I am going to rx a low dose of a potassium supplement- 10 meq- for you to take daily.  You can continue to take the potassium as long as you are on the HCTZ for your blood pressure Your labs do not suggest rheumatoid arthritis or gout.  How is the medication working for your thumb? Please see me in 4-6 months for a recheck   Lab Results  Component Value Date   HGBA1C 6.3 05/25/2018   Flu- will do for her today  Pneumonia- prevnar today  tdap- pt declines for today  shingrix- she is interested in doing this at her drug store   toprol xl Amlodipine Losartan hctz metfromin 1000 BID- however pt reports she is actually taking 500 BID only  k 10 meq daily   She notes that she is gaining more weight again- shew as on trulicity in the past and was under 200 lbs BP has been  under ok control BP Readings from Last 3 Encounters:  11/09/18 (!) 142/90  07/25/18 (!) 162/94  05/25/18 134/80     Wt Readings from Last 3 Encounters:  11/09/18 213 lb (96.6 kg)  07/25/18 206 lb 12.8 oz (93.8 kg)  05/25/18 207 lb 9.6 oz (94.2 kg)   We stopped trulicity for her a year or so ago as her A1c was under such good control. However since stopping it she has gained 20 lbs approx. She would like to go back on if she can   Patient Active Problem List   Diagnosis Date Noted  . Essential hypertension 07/25/2018  . Diabetes mellitus type 2, controlled, without complications (HCC) 03/16/2016  . Epistaxis, recurrent 07/18/2013  . DJD (degenerative joint disease) 10/20/2011  . HYPERCHOLESTEROLEMIA 05/02/2008  . OVERWEIGHT 05/02/2008  . FATTY LIVER DISEASE 05/02/2008  . CHOLELITHIASIS 05/02/2008  . HYPERTENSION 05/01/2008  . ASTHMA 05/01/2008  . BACK PAIN, LUMBAR 05/01/2008  . ALLERGY 05/01/2008    Past Medical History:  Diagnosis Date  . Acute bronchitis   . Allergy, unspecified not elsewhere classified   . Arthritis   . Asthma   . Bleeding nose   . Cholelithiasis   . Depression   . Diabetes mellitus type 2, controlled, without complications (HCC) 03/16/2016  . Fatty liver   . Hypercholesterolemia   .  Hypertension   . Lumbar back pain   . Overweight(278.02)     Past Surgical History:  Procedure Laterality Date  . INCISIONAL HERNIA REPAIR  2001  . VESICOVAGINAL FISTULA CLOSURE W/ TAH  2000    Social History   Tobacco Use  . Smoking status: Never Smoker  . Smokeless tobacco: Never Used  Substance Use Topics  . Alcohol use: No  . Drug use: No    Family History  Problem Relation Age of Onset  . Asthma Unknown     Allergies  Allergen Reactions  . Penicillins     Swelling, rash, hives all over    Medication list has been reviewed and updated.  Current Outpatient Medications on File Prior to Visit  Medication Sig Dispense Refill  . amLODipine  (NORVASC) 5 MG tablet TAKE 1 TABLET(5 MG) BY MOUTH DAILY 90 tablet 1  . fluticasone (FLONASE) 50 MCG/ACT nasal spray Place 2 sprays into both nostrils daily. 16 g 6  . loratadine (CLARITIN) 10 MG tablet Take 1 tablet (10 mg total) by mouth daily. 30 tablet 11  . losartan-hydrochlorothiazide (HYZAAR) 100-25 MG tablet TAKE 1 TABLET BY MOUTH DAILY 90 tablet 1  . meclizine (ANTIVERT) 25 MG tablet Take 1 tablet (25 mg total) by mouth 3 (three) times daily as needed for dizziness. 30 tablet 0  . metFORMIN (GLUCOPHAGE) 500 MG tablet TAKE 2 TABLETS BY MOUTH TWICE DAILY FOR BLOOD SUGAR 360 tablet 1  . metoprolol succinate (TOPROL-XL) 50 MG 24 hr tablet Take 1 tablet (50 mg total) by mouth daily. Take with or immediately following a meal. (Patient taking differently: Take 25 mg by mouth daily. Take with or immediately following a meal.) 30 tablet 3  . potassium chloride (MICRO-K) 10 MEQ CR capsule Take 1 capsule (10 mEq total) by mouth daily. 90 capsule 1   No current facility-administered medications on file prior to visit.     Review of Systems:  As per HPI- otherwise negative. No fever or chills No CP or SOB    Physical Examination: Vitals:   11/09/18 1439  BP: (!) 154/84  Pulse: 94  Resp: 16  Temp: 98.6 F (37 C)  SpO2: 97%   Vitals:   11/09/18 1439  Weight: 213 lb (96.6 kg)  Height: 5\' 3"  (1.6 m)   Body mass index is 37.73 kg/m. Ideal Body Weight: Weight in (lb) to have BMI = 25: 140.8 GEN: WDWN, NAD, Non-toxic, A & O x 3, obese, looks well otherwise  HEENT: Atraumatic, Normocephalic. Neck supple. No masses, No LAD. Bilateral TM wnl, oropharynx normal.  PEERL,EOMI.   Ears and Nose: No external deformity. CV: RRR, No M/G/R. No JVD. No thrill. No extra heart sounds. PULM: CTA B, no wheezes, crackles, rhonchi. No retractions. No resp. distress. No accessory muscle use. EXTR: No c/c/e NEURO Normal gait.  PSYCH: Normally interactive. Conversant. Not depressed or anxious appearing.   Calm demeanor.    Assessment and Plan: Hyperlipidemia associated with type 2 diabetes mellitus (HCC)  Essential hypertension - Plan: amLODipine (NORVASC) 5 MG tablet, losartan-hydrochlorothiazide (HYZAAR) 100-25 MG tablet, metoprolol succinate (TOPROL-XL) 25 MG 24 hr tablet, potassium chloride (MICRO-K) 10 MEQ CR capsule  Controlled type 2 diabetes mellitus with complication, without long-term current use of insulin (HCC) - Plan: Hemoglobin A1c  Medication monitoring encounter - Plan: Basic metabolic panel  Immunization due - Plan: Pneumococcal conjugate vaccine 13-valent IM  Need for influenza vaccination - Plan: Flu vaccine HIGH DOSE PF (Fluzone High dose)  Following  up today BP is under ok control- refilled meds. May need to go up on her amlodipine later if continuing to run on the high side A1c and BMP pending Flu and prevnar today Will plan further follow- up pending labs.  Pending A1c may put her back on trulicity    Signed Abbe Amsterdam, MD  11/15 Received her labs, message to pt  Results for orders placed or performed in visit on 11/09/18  Hemoglobin A1c  Result Value Ref Range   Hgb A1c MFr Bld 6.8 (H) 4.6 - 6.5 %  Basic metabolic panel  Result Value Ref Range   Sodium 139 135 - 145 mEq/L   Potassium 3.2 (L) 3.5 - 5.1 mEq/L   Chloride 101 96 - 112 mEq/L   CO2 28 19 - 32 mEq/L   Glucose, Bld 145 (H) 70 - 99 mg/dL   BUN 17 6 - 23 mg/dL   Creatinine, Ser 4.09 0.40 - 1.20 mg/dL   Calcium 9.3 8.4 - 81.1 mg/dL   GFR 91.47 >82.95 mL/min   Your kidney function is fine.  However, your potassium is a bit low.  Please take 2 of the potassium pills (20 meq total) for 3 days.  Then go back to regular dosage.  I would also encourage you to increase your dietary potassium intake- you can find a list of high potassium foods online  Your A1c has gone up a bit.  I was thinking we might add back trulicity if you like.  I had been concerned about the risk of low blood sugar  but it appears that this is a rare adverse effect, so I think we can try it.  Would you like me to rx trulicity for you again?  Just let me know here!

## 2018-11-09 ENCOUNTER — Encounter: Payer: Self-pay | Admitting: Family Medicine

## 2018-11-09 ENCOUNTER — Ambulatory Visit: Payer: BLUE CROSS/BLUE SHIELD | Admitting: Family Medicine

## 2018-11-09 VITALS — BP 142/90 | HR 94 | Temp 98.6°F | Resp 16 | Ht 63.0 in | Wt 213.0 lb

## 2018-11-09 DIAGNOSIS — E118 Type 2 diabetes mellitus with unspecified complications: Secondary | ICD-10-CM

## 2018-11-09 DIAGNOSIS — I10 Essential (primary) hypertension: Secondary | ICD-10-CM

## 2018-11-09 DIAGNOSIS — E1169 Type 2 diabetes mellitus with other specified complication: Secondary | ICD-10-CM | POA: Diagnosis not present

## 2018-11-09 DIAGNOSIS — Z5181 Encounter for therapeutic drug level monitoring: Secondary | ICD-10-CM | POA: Diagnosis not present

## 2018-11-09 DIAGNOSIS — E785 Hyperlipidemia, unspecified: Secondary | ICD-10-CM | POA: Diagnosis not present

## 2018-11-09 DIAGNOSIS — Z23 Encounter for immunization: Secondary | ICD-10-CM

## 2018-11-09 MED ORDER — LOSARTAN POTASSIUM-HCTZ 100-25 MG PO TABS: 1.0000 | ORAL_TABLET | Freq: Every day | ORAL | 3 refills | Status: DC

## 2018-11-09 MED ORDER — AMLODIPINE BESYLATE 5 MG PO TABS: ORAL_TABLET | ORAL | 3 refills | Status: DC

## 2018-11-09 MED ORDER — POTASSIUM CHLORIDE ER 10 MEQ PO CPCR: 10.0000 meq | ORAL_CAPSULE | Freq: Every day | ORAL | 3 refills | Status: DC

## 2018-11-09 MED ORDER — METOPROLOL SUCCINATE ER 25 MG PO TB24: 25.0000 mg | ORAL_TABLET | Freq: Every day | ORAL | 3 refills | Status: DC

## 2018-11-09 NOTE — Patient Instructions (Addendum)
Good to see you today- I will be in touch with your labs and we can decide about going back on trulicy at that time  You got your flu and 1st of 2 pneumonia vaccines today  Take care,we can plan to visit in abut 6 months

## 2018-11-10 LAB — HEMOGLOBIN A1C: Hgb A1c MFr Bld: 6.8 % — ABNORMAL HIGH (ref 4.6–6.5)

## 2018-11-10 LAB — BASIC METABOLIC PANEL
BUN: 17 mg/dL (ref 6–23)
CHLORIDE: 101 meq/L (ref 96–112)
CO2: 28 mEq/L (ref 19–32)
Calcium: 9.3 mg/dL (ref 8.4–10.5)
Creatinine, Ser: 0.72 mg/dL (ref 0.40–1.20)
GFR: 85.96 mL/min (ref >60.00)
Glucose, Bld: 145 mg/dL — ABNORMAL HIGH (ref 70–99)
POTASSIUM: 3.2 meq/L — AB (ref 3.5–5.1)
SODIUM: 139 meq/L (ref 135–145)

## 2018-11-11 ENCOUNTER — Encounter: Payer: Self-pay | Admitting: Family Medicine

## 2018-11-11 DIAGNOSIS — E118 Type 2 diabetes mellitus with unspecified complications: Secondary | ICD-10-CM

## 2018-11-18 MED ORDER — DULAGLUTIDE 0.75 MG/0.5ML ~~LOC~~ SOAJ: SUBCUTANEOUS | 3 refills | Status: DC

## 2018-12-16 ENCOUNTER — Other Ambulatory Visit: Payer: Self-pay | Admitting: Family Medicine

## 2018-12-16 DIAGNOSIS — I10 Essential (primary) hypertension: Secondary | ICD-10-CM

## 2018-12-23 ENCOUNTER — Encounter: Payer: Self-pay | Admitting: Family Medicine

## 2018-12-29 DIAGNOSIS — E119 Type 2 diabetes mellitus without complications: Secondary | ICD-10-CM | POA: Diagnosis not present

## 2019-01-20 ENCOUNTER — Other Ambulatory Visit: Payer: Self-pay

## 2019-01-20 DIAGNOSIS — E119 Type 2 diabetes mellitus without complications: Secondary | ICD-10-CM

## 2019-01-20 DIAGNOSIS — I10 Essential (primary) hypertension: Secondary | ICD-10-CM

## 2019-01-20 MED ORDER — LOSARTAN POTASSIUM-HCTZ 100-25 MG PO TABS: 1.0000 | ORAL_TABLET | Freq: Every day | ORAL | 3 refills | Status: DC

## 2019-01-20 MED ORDER — AMLODIPINE BESYLATE 5 MG PO TABS: ORAL_TABLET | ORAL | 3 refills | Status: DC

## 2019-01-20 MED ORDER — METOPROLOL SUCCINATE ER 25 MG PO TB24: 25.0000 mg | ORAL_TABLET | Freq: Every day | ORAL | 3 refills | Status: DC

## 2019-01-20 MED ORDER — POTASSIUM CHLORIDE ER 10 MEQ PO CPCR: 10.0000 meq | ORAL_CAPSULE | Freq: Every day | ORAL | 3 refills | Status: DC

## 2019-01-20 MED ORDER — METFORMIN HCL 500 MG PO TABS: ORAL_TABLET | ORAL | 1 refills | Status: DC

## 2019-01-23 ENCOUNTER — Encounter: Payer: Self-pay | Admitting: Family Medicine

## 2019-02-14 NOTE — Progress Notes (Addendum)
[facility] [address] [place], [place] [address] [phone]  Date:  02/15/2019   Name:  Anne Christensen   DOB:  July 03, 1951   MRN:  [id number]  PCP:  Anne Cables, MD    Chief Complaint: Lab Work (a1c); Diabetes (discuss trulicity); and Medicare Wellness (needs to schedule awv at checkout)   History of Present Illness:  Anne Christensen is a 68 y.o. very pleasant female patient who presents with the following:  Anne Christensen has history of diabetes, hypertension, hypercholesteremia, fatty liver. Here today for diabetes recheck  I last saw her in November At that time she had gained some weight, and wanted to go back on Trulicity Her potassium was also low, will check basic metabolic today  Lab Results  Component Value Date   HGBA1C 6.8 (H) 11/09/2018   Foot exam: Due today Eye exam; 12/29/2018- per [facility]   She notes that she tends to get tired in the afternoons around this time, but she gets up at 0400.  She will go to bed at 8:30 or 9 She gets up so early due to her dogs and because her husband needs to get ready for work  Encouraged her to take a short nap if needed   She checked her glucose today and it was 150 after a meal No low glucose noted   Pt notes that her husband had an MI and got a stent just recently She is worried that the same could happen to her No family history of heart disease herself  She does not have any symptoms of chest pain or shortness of breath.  Admits she does not get a lot of exercise  Wt Readings from Last 3 Encounters:  02/15/19 206 lb (93.4 kg)  11/09/18 213 lb (96.6 kg)  07/25/18 206 lb 12.8 oz (93.8 kg)     Patient Active Problem List   Diagnosis Date Noted  . Essential hypertension 07/25/2018  . Diabetes mellitus type 2, controlled, without complications (HCC) 03/16/2016  . Epistaxis, recurrent 07/18/2013  . DJD (degenerative joint disease) 10/20/2011  .  HYPERCHOLESTEROLEMIA 05/02/2008  . OVERWEIGHT 05/02/2008  . FATTY LIVER DISEASE 05/02/2008  . CHOLELITHIASIS 05/02/2008  . HYPERTENSION 05/01/2008  . ASTHMA 05/01/2008  . BACK PAIN, LUMBAR 05/01/2008  . ALLERGY 05/01/2008    Past Medical History:  Diagnosis Date  . Acute bronchitis   . Allergy, unspecified not elsewhere classified   . Arthritis   . Asthma   . Bleeding nose   . Cholelithiasis   . Depression   . Diabetes mellitus type 2, controlled, without complications (HCC) 03/16/2016  . Fatty liver   . Hypercholesterolemia   . Hypertension   . Lumbar back pain   . Overweight(278.02)     Past Surgical History:  Procedure Laterality Date  . INCISIONAL HERNIA REPAIR  2001  . VESICOVAGINAL FISTULA CLOSURE W/ TAH  2000    Social History   Tobacco Use  . Smoking status: Never Smoker  . Smokeless tobacco: Never Used  Substance Use Topics  . Alcohol use: No  . Drug use: No    Family History  Problem Relation Age of Onset  . Asthma Unknown     Allergies  Allergen Reactions  . Penicillins     Swelling, rash, hives all over    Medication list has been reviewed and updated.  Current Outpatient Medications on File Prior to Visit  Medication Sig  Dispense Refill  . amLODipine (NORVASC) 5 MG tablet Take one tablet by mouth daily. 90 tablet 3  . Dulaglutide (TRULICITY) 0.75 MG/0.5ML SOPN Inject one dose of 0.75 mg subcue weekly 12 pen 3  . fluticasone (FLONASE) 50 MCG/ACT nasal spray Place 2 sprays into both nostrils daily. 16 g 6  . loratadine (CLARITIN) 10 MG tablet Take 1 tablet (10 mg total) by mouth daily. 30 tablet 11  . losartan-hydrochlorothiazide (HYZAAR) 100-25 MG tablet Take 1 tablet by mouth daily. 90 tablet 3  . meclizine (ANTIVERT) 25 MG tablet Take 1 tablet (25 mg total) by mouth 3 (three) times daily as needed for dizziness. 30 tablet 0  . metFORMIN (GLUCOPHAGE) 500 MG tablet Take two tablets by mouth twice a day. 360 tablet 1  . metoprolol succinate  (TOPROL-XL) 25 MG 24 hr tablet Take 1 tablet (25 mg total) by mouth daily. Take with or immediately following a meal. 90 tablet 3  . potassium chloride (MICRO-K) 10 MEQ CR capsule Take 1 capsule (10 mEq total) by mouth daily. 90 capsule 3   No current facility-administered medications on file prior to visit.     Review of Systems:  As per HPI- otherwise negative. No fever or chills  Physical Examination: Vitals:   02/15/19 1441 02/15/19 1500  BP: (!) 142/88 138/80  Pulse: (!) 107 90  Resp: 16   Temp: 99 F (37.2 C)   SpO2: 98%    Vitals:   02/15/19 1441  Weight: 206 lb (93.4 kg)  Height: 5\' 3"  (1.6 m)   Body mass index is 36.49 kg/m. Ideal Body Weight: Weight in (lb) to have BMI = 25: 140.8  GEN: WDWN, NAD, Non-toxic, A & O x 3, obese, otherwise looks well HEENT: Atraumatic, Normocephalic. Neck supple. No masses, No LAD. Ears and Nose: No external deformity. CV: RRR, No M/G/R. No JVD. No thrill. No extra heart sounds. PULM: CTA B, no wheezes, crackles, rhonchi. No retractions. No resp. distress. No accessory muscle use.  EXTR: No c/c/e NEURO Normal gait.  PSYCH: Normally interactive. Conversant. Not depressed or anxious appearing.  Calm demeanor.  Foot exam is normal today   Assessment and Plan: Controlled type 2 diabetes mellitus with complication, without long-term current use of insulin (HCC) - Plan: Hemoglobin A1c  Hyperlipidemia associated with type 2 diabetes mellitus (HCC) - Plan: Lipid panel  Essential hypertension - Plan: Basic metabolic panel  Screening for heart disease  Here today for recheck visit A1c is pending, we re-added Trulicity recently.  She has lost a few pounds of this medication, encouraged her to keep this up Cholesterol pending Blood pressure is under good control, continue Hyzaar and metoprolol She is also taking amlodipine 5, and potassium Foot exam done today Discussed her concerns about heart disease.  I offered to set her up for a  stress test, but at this time she declines. She did have an EKG last July I encouraged her to let me know if she would like a treadmill test, advised her this would be no problem to set up  Otherwise we will plan to recheck in 6 months  Signed Abbe AmsterdamJessica Hilliard Borges, MD  Addendum 2/20 Received her labs as below, message to patient Results for orders placed or performed in visit on 02/15/19  Hemoglobin A1c  Result Value Ref Range   Hgb A1c MFr Bld 6.1 4.6 - 6.5 %  Basic metabolic panel  Result Value Ref Range   Sodium 140 135 - 145 mEq/L  Potassium 3.1 (L) 3.5 - 5.1 mEq/L   Chloride 100 96 - 112 mEq/L   CO2 28 19 - 32 mEq/L   Glucose, Bld 143 (H) 70 - 99 mg/dL   BUN 17 6 - 23 mg/dL   Creatinine, Ser 6.83 0.40 - 1.20 mg/dL   Calcium 9.3 8.4 - 41.9 mg/dL   GFR 62.22 >97.98 mL/min  Lipid panel  Result Value Ref Range   Cholesterol 166 0 - 200 mg/dL   Triglycerides 921.1 0.0 - 149.0 mg/dL   HDL 94.17 >40.81 mg/dL   VLDL 44.8 0.0 - 18.5 mg/dL   LDL Cholesterol 89 0 - 99 mg/dL   Total CHOL/HDL Ratio 3    NonHDL 117.90    She is only taking 10 mEq of potassium, this does not seem to be enough.  She is also on HCTZ 25 mg daily   Your A1c looks good, shows excellent control of your blood sugar You have dropped 0.7% since adding Trulicity Cholesterol looks pretty good, but your 10-year estimated risk of cardiovascular disease is surprisingly high.  See calculation below. The 10-year ASCVD risk score Denman George DC Montez Hageman., et al., 2013) is: 17%   Values used to calculate the score:     Age: 78 years     Sex: Female     Is Non-Hispanic African American: No     Diabetic: Yes     Tobacco smoker: No     Systolic Blood Pressure: 138 mmHg     Is BP treated: Yes     HDL Cholesterol: 48.3 mg/dL     Total Cholesterol: 166 mg/dL  Although your cholesterol numbers are already good, I would suggest that we add a cholesterol medication.  This may help to reduce your risk of heart disease Let me know  if you would be okay with starting a cholesterol med  Finally, your potassium is low.  This is likely due to your hydrochlorothiazide.  You are taking potassium, but only 10 mEq.  I do not think this is enough. Please increase to 2 of your potassium tablets a day, and let us recheck a potassium level in 2 or 3 weeks.  This can be done as a lab appointment only, I will order the test for you  Otherwise, let us plan to recheck an A1c and have an office visit in 4 months

## 2019-02-15 ENCOUNTER — Ambulatory Visit (INDEPENDENT_AMBULATORY_CARE_PROVIDER_SITE_OTHER): Payer: Medicare Other | Admitting: Family Medicine

## 2019-02-15 ENCOUNTER — Encounter: Payer: Self-pay | Admitting: Family Medicine

## 2019-02-15 VITALS — BP 138/80 | HR 90 | Temp 99.0°F | Resp 16 | Ht 63.0 in | Wt 206.0 lb

## 2019-02-15 DIAGNOSIS — E118 Type 2 diabetes mellitus with unspecified complications: Secondary | ICD-10-CM | POA: Diagnosis not present

## 2019-02-15 DIAGNOSIS — E1169 Type 2 diabetes mellitus with other specified complication: Secondary | ICD-10-CM

## 2019-02-15 DIAGNOSIS — E876 Hypokalemia: Secondary | ICD-10-CM | POA: Diagnosis not present

## 2019-02-15 DIAGNOSIS — I10 Essential (primary) hypertension: Secondary | ICD-10-CM | POA: Diagnosis not present

## 2019-02-15 DIAGNOSIS — E785 Hyperlipidemia, unspecified: Secondary | ICD-10-CM | POA: Diagnosis not present

## 2019-02-15 DIAGNOSIS — Z136 Encounter for screening for cardiovascular disorders: Secondary | ICD-10-CM

## 2019-02-15 NOTE — Patient Instructions (Addendum)
It was good to see you today!  Take care - let's visit in about 6 months  Please try to get some more exercise; if you develop any chest pain or SOB with exercise let me know  If you are concerned about your heart we can always do a treadmill stress for you, this is not a big deal to do

## 2019-02-16 ENCOUNTER — Encounter: Payer: Self-pay | Admitting: Family Medicine

## 2019-02-16 LAB — LIPID PANEL
Cholesterol: 166 mg/dL (ref 0–200)
HDL: 48.3 mg/dL (ref >39.00)
LDL CALC: 89 mg/dL (ref 0–99)
NONHDL: 117.9
Total CHOL/HDL Ratio: 3
Triglycerides: 143 mg/dL (ref 0.0–149.0)
VLDL: 28.6 mg/dL (ref 0.0–40.0)

## 2019-02-16 LAB — BASIC METABOLIC PANEL
BUN: 17 mg/dL (ref 6–23)
CHLORIDE: 100 meq/L (ref 96–112)
CO2: 28 mEq/L (ref 19–32)
CREATININE: 0.75 mg/dL (ref 0.40–1.20)
Calcium: 9.3 mg/dL (ref 8.4–10.5)
GFR: 77.09 mL/min (ref >60.00)
GLUCOSE: 143 mg/dL — AB (ref 70–99)
Potassium: 3.1 mEq/L — ABNORMAL LOW (ref 3.5–5.1)
Sodium: 140 mEq/L (ref 135–145)

## 2019-02-16 LAB — HEMOGLOBIN A1C: Hgb A1c MFr Bld: 6.1 % (ref 4.6–6.5)

## 2019-02-16 NOTE — Addendum Note (Signed)
Addended by: Pearline Cables on: 02/16/2019 07:36 PM   Modules accepted: Orders

## 2019-02-26 ENCOUNTER — Encounter: Payer: Self-pay | Admitting: Family Medicine

## 2019-04-26 ENCOUNTER — Other Ambulatory Visit: Payer: Self-pay

## 2019-04-26 NOTE — Patient Outreach (Signed)
[facility]) Care Management  04/26/2019  Anne Christensen 1951/10/26 [id number]   Medication Adherence call to Mrs. Anne Christensen HIPPA Compliant Voice message left with a call back number. Anne Christensen is showing past due under [facility] Ins.   Lillia Abed CPhT Pharmacy Technician [facility] Management Direct Dial [phone]  Fax [phone] [email or web address]

## 2019-05-08 ENCOUNTER — Other Ambulatory Visit: Payer: Self-pay

## 2019-05-08 NOTE — Patient Outreach (Signed)
[facility]) Care Management  05/08/2019  Loubertha Beld 1951/08/13 [id number]   Medication Adherence call to Mrs. Stefanee Grigas HIPPA Compliant Voice message left with a call back number. Mrs. Marlette is showing past due on Losartan/Hctz 100/25 mg and Metformin 500 mg under [employer] Ins.   Lillia Abed CPhT Pharmacy Technician [facility] Management Direct Dial [phone]  Fax [phone] [email or web address]

## 2019-05-29 ENCOUNTER — Other Ambulatory Visit: Payer: Self-pay

## 2019-05-29 NOTE — Patient Outreach (Signed)
[facility]) Care Management  05/29/2019  Anne Christensen [birth year] [id number]   Medication Adherence call to Anne Christensen Telephone call to Patient regarding Medication Adherence unable to reach patient Anne Christensen is showing past due on Losartan/Hctz 100/25 and Metformin 500 mg under [employer] Ins.   Anne Christensen CPhT Pharmacy Technician [facility] Management Direct Dial [phone]  Fax [phone] [email or web address]

## 2019-09-03 NOTE — Progress Notes (Addendum)
[facility] [address] [place], [place] [address] [phone]  Date:  09/06/2019   Name:  Anne Christensen   DOB:  Apr 24, 1951   MRN:  [id number]  PCP:  Darreld Mclean, MD    Chief Complaint: Diabetes (A1C), Fatigue (fatigue in afternoon, higher than normal blood sugar readings), and Flu Vaccine   History of Present Illness:  Anne Christensen is a 68 y.o. very pleasant female patient who presents with the following:  Here today for an in person follow-up visit History of HTN, DM, hyperlipidemia, overweight, asthma Last seen by myself in February for a routine recheck visit: A1c is pending, we re-added Trulicity recently.  She has lost a few pounds of this medication, encouraged her to keep this up Cholesterol pending Blood pressure is under good control, continue Hyzaar and metoprolol She is also taking amlodipine 5, and potassium Foot exam done today Discussed her concerns about heart disease.  I offered to set her up for a stress test, but at this time she declines. She did have an EKG last July I encouraged her to let me know if she would like a treadmill test, advised her this would be no problem to set up  Lab Results  Component Value Date   HGBA1C 6.1 02/15/2019   Flu: give today  Tetanus immun:  She thinks more than 10 years ago, will update today Mammo due: order today  Shingrix: Suggested, due at pharmacy Labs:  Needs A1c   We need to watch her potassium level, has sometimes been low- she is on supplement  Metformin toprol xl kdur- taking 2 daily = 20 meq total Amlodipine hyzaar trulicity- NOT taking this as she thought it was causing fatigue  Stopped this "several months" ago   She notes that she has felt tired for a couple of months- she is not sleeping well as her dog wakes her up, and she gets up quite early as her husband has to go to work early She has to give her dog insulin twice a day, he  is very old and wakes her up several times per night She thinks this is probably why she feels so tired She is able to nap sometimes in the afternoon   Wt Readings from Last 3 Encounters:  09/06/19 209 lb (94.8 kg)  02/15/19 206 lb (93.4 kg)  11/09/18 213 lb (96.6 kg)   She is working from home, her husband is going in to work still   She plans to retire this winter, and is looking forward to having more time for herself and for exercise   Patient Active Problem List   Diagnosis Date Noted  . Essential hypertension 07/25/2018  . Diabetes mellitus type 2, controlled, without complications ([place]) 40/09/2724  . Epistaxis, recurrent 07/18/2013  . DJD (degenerative joint disease) 10/20/2011  . HYPERCHOLESTEROLEMIA 05/02/2008  . OVERWEIGHT 05/02/2008  . FATTY LIVER DISEASE 05/02/2008  . CHOLELITHIASIS 05/02/2008  . HYPERTENSION 05/01/2008  . ASTHMA 05/01/2008  . BACK PAIN, LUMBAR 05/01/2008  . ALLERGY 05/01/2008    Past Medical History:  Diagnosis Date  . Acute bronchitis   . Allergy, unspecified not elsewhere classified   . Arthritis   . Asthma   . Bleeding nose   . Cholelithiasis   . Depression   . Diabetes mellitus type 2, controlled, without complications ([place]) 3/66/4403  . Fatty liver   . Hypercholesterolemia   . Hypertension   .  Lumbar back pain   . Overweight(278.02)     Past Surgical History:  Procedure Laterality Date  . INCISIONAL HERNIA REPAIR  2001  . VESICOVAGINAL FISTULA CLOSURE W/ TAH  2000    Social History   Tobacco Use  . Smoking status: Never Smoker  . Smokeless tobacco: Never Used  Substance Use Topics  . Alcohol use: No  . Drug use: No    Family History  Problem Relation Age of Onset  . Asthma Unknown     Allergies  Allergen Reactions  . Penicillins     Swelling, rash, hives all over    Medication list has been reviewed and updated.  Current Outpatient Medications on File Prior to Visit  Medication Sig Dispense Refill  .  amLODipine (NORVASC) 5 MG tablet Take one tablet by mouth daily. 90 tablet 3  . Dulaglutide (TRULICITY) 0.75 MG/0.5ML SOPN Inject one dose of 0.75 mg subcue weekly 12 pen 3  . fluticasone (FLONASE) 50 MCG/ACT nasal spray Place 2 sprays into both nostrils daily. 16 g 6  . loratadine (CLARITIN) 10 MG tablet Take 1 tablet (10 mg total) by mouth daily. 30 tablet 11  . losartan-hydrochlorothiazide (HYZAAR) 100-25 MG tablet Take 1 tablet by mouth daily. 90 tablet 3  . meclizine (ANTIVERT) 25 MG tablet Take 1 tablet (25 mg total) by mouth 3 (three) times daily as needed for dizziness. 30 tablet 0  . metFORMIN (GLUCOPHAGE) 500 MG tablet Take two tablets by mouth twice a day. 360 tablet 1  . metoprolol succinate (TOPROL-XL) 25 MG 24 hr tablet Take 1 tablet (25 mg total) by mouth daily. Take with or immediately following a meal. 90 tablet 3  . potassium chloride (MICRO-K) 10 MEQ CR capsule Take 1 capsule (10 mEq total) by mouth daily. 90 capsule 3   No current facility-administered medications on file prior to visit.     Review of Systems:  As per HPI- otherwise negative.  No fever or chills, no chest pain or shortness of breath Physical Examination: Vitals:   09/06/19 0842 09/06/19 0900  BP: (!) 150/95 (!) 144/88  Pulse:  74  Resp:    Temp:    SpO2:     Vitals:   09/06/19 0819  Weight: 209 lb (94.8 kg)  Height: 5\' 3"  (1.6 m)   Body mass index is 37.02 kg/m. Ideal Body Weight: Weight in (lb) to have BMI = 25: 140.8  GEN: WDWN, NAD, Non-toxic, A & O x 3 obese, looks well HEENT: Atraumatic, Normocephalic. Neck supple. No masses, No LAD. Ears and Nose: No external deformity. CV: RRR, No M/G/R. No JVD. No thrill. No extra heart sounds. PULM: CTA B, no wheezes, crackles, rhonchi. No retractions. No resp. distress. No accessory muscle use. ABD: S, NT, ND, +BS. No rebound. No HSM. EXTR: No c/c/e.  Patient notes some mild swelling of her fingers, her ankles do not swell however NEURO Normal  gait.  PSYCH: Normally interactive. Conversant. Not depressed or anxious appearing.  Calm demeanor.  Open scratch from her dog- right arm  Does not need suture   BP Readings from Last 3 Encounters:  09/06/19 (!) 144/88  02/15/19 138/80  11/09/18 (!) 142/90     Assessment and Plan: Controlled type 2 diabetes mellitus with complication, without long-term current use of insulin (HCC) - Plan: Comprehensive metabolic panel, Hemoglobin A1c  Hyperlipidemia associated with type 2 diabetes mellitus (HCC)  Essential hypertension - Plan: CBC  Hypokalemia  Medication monitoring encounter  Screening for  breast cancer - Plan: MM 3D SCREEN BREAST BILATERAL  Vitamin D deficiency - Plan: Vitamin D (25 hydroxy)  Open wound of right forearm, initial encounter - Plan: Td vaccine greater than or equal to 67yo preservative free IM  Needs flu shot - Plan: Flu Vaccine QUAD High Dose(Fluad)  Here today for a follow-up visit Flu shot given Tetanus updated Blood pressure improved on recheck She stopped taking Trulicity, check A1c today Also check potassium Will plan further follow- up pending labs. Encouraged exercise  Signed Abbe AmsterdamJessica Angas Isabell, MD  Received her labs, message to pt  Results for orders placed or performed in visit on 09/06/19  Comprehensive metabolic panel  Result Value Ref Range   Sodium 140 135 - 145 mEq/L   Potassium 3.6 3.5 - 5.1 mEq/L   Chloride 103 96 - 112 mEq/L   CO2 26 19 - 32 mEq/L   Glucose, Bld 158 (H) 70 - 99 mg/dL   BUN 18 6 - 23 mg/dL   Creatinine, Ser 1.610.68 0.40 - 1.20 mg/dL   Total Bilirubin 0.4 0.2 - 1.2 mg/dL   Alkaline Phosphatase 48 39 - 117 U/L   AST 19 0 - 37 U/L   ALT 25 0 - 35 U/L   Total Protein 7.3 6.0 - 8.3 g/dL   Albumin 4.1 3.5 - 5.2 g/dL   Calcium 9.2 8.4 - 09.610.5 mg/dL   GFR 04.5486.18 >09.81>60.00 mL/min  CBC  Result Value Ref Range   WBC 9.2 4.0 - 10.5 K/uL   RBC 4.84 3.87 - 5.11 Mil/uL   Platelets 247.0 150.0 - 400.0 K/uL   Hemoglobin 13.7  12.0 - 15.0 g/dL   HCT 19.140.7 47.836.0 - 29.546.0 %   MCV 84.2 78.0 - 100.0 fl   MCHC 33.6 30.0 - 36.0 g/dL   RDW 62.114.5 30.811.5 - 65.715.5 %  Hemoglobin A1c  Result Value Ref Range   Hgb A1c MFr Bld 6.9 (H) 4.6 - 6.5 %  Vitamin D (25 hydroxy)  Result Value Ref Range   VITD 23.11 (L) 30.00 - 100.00 ng/mL     Metabolic profile and potassium look good Will have you continue your current potassium dosage of 2 x 10 meq daily = 20 total  Meds ordered this encounter  Medications  . potassium chloride (MICRO-K) 10 MEQ CR capsule    Sig: Take 2 capsules (20 mEq total) by mouth daily.    Dispense:  180 capsule    Refill:  3   Blood counts are normal Your A1c shows ok control of diabetes- continue current dose of metformin   Your vitamin D is low- I would recommend that you take 2,000 IU of vitamin D daily.  If you are already doing this let me know!   Otherwise let's please plan to visit in about 4 months

## 2019-09-06 ENCOUNTER — Other Ambulatory Visit: Payer: Self-pay

## 2019-09-06 ENCOUNTER — Encounter: Payer: Self-pay | Admitting: Family Medicine

## 2019-09-06 ENCOUNTER — Ambulatory Visit (INDEPENDENT_AMBULATORY_CARE_PROVIDER_SITE_OTHER): Payer: Medicare Other | Admitting: Family Medicine

## 2019-09-06 VITALS — BP 144/88 | HR 74 | Temp 97.1°F | Resp 18 | Ht 63.0 in | Wt 209.0 lb

## 2019-09-06 DIAGNOSIS — E559 Vitamin D deficiency, unspecified: Secondary | ICD-10-CM | POA: Diagnosis not present

## 2019-09-06 DIAGNOSIS — E118 Type 2 diabetes mellitus with unspecified complications: Secondary | ICD-10-CM | POA: Diagnosis not present

## 2019-09-06 DIAGNOSIS — Z5181 Encounter for therapeutic drug level monitoring: Secondary | ICD-10-CM

## 2019-09-06 DIAGNOSIS — Z23 Encounter for immunization: Secondary | ICD-10-CM | POA: Diagnosis not present

## 2019-09-06 DIAGNOSIS — E876 Hypokalemia: Secondary | ICD-10-CM

## 2019-09-06 DIAGNOSIS — Z1239 Encounter for other screening for malignant neoplasm of breast: Secondary | ICD-10-CM

## 2019-09-06 DIAGNOSIS — I10 Essential (primary) hypertension: Secondary | ICD-10-CM

## 2019-09-06 DIAGNOSIS — E785 Hyperlipidemia, unspecified: Secondary | ICD-10-CM

## 2019-09-06 DIAGNOSIS — S51801A Unspecified open wound of right forearm, initial encounter: Secondary | ICD-10-CM

## 2019-09-06 DIAGNOSIS — E1169 Type 2 diabetes mellitus with other specified complication: Secondary | ICD-10-CM | POA: Diagnosis not present

## 2019-09-06 LAB — CBC
HCT: 40.7 % (ref 36.0–46.0)
Hemoglobin: 13.7 g/dL (ref 12.0–15.0)
MCHC: 33.6 g/dL (ref 30.0–36.0)
MCV: 84.2 fl (ref 78.0–100.0)
Platelets: 247 10*3/uL (ref 150.0–400.0)
RBC: 4.84 Mil/uL (ref 3.87–5.11)
RDW: 14.5 % (ref 11.5–15.5)
WBC: 9.2 10*3/uL (ref 4.0–10.5)

## 2019-09-06 LAB — COMPREHENSIVE METABOLIC PANEL
ALT: 25 U/L (ref 0–35)
AST: 19 U/L (ref 0–37)
Albumin: 4.1 g/dL (ref 3.5–5.2)
Alkaline Phosphatase: 48 U/L (ref 39–117)
BUN: 18 mg/dL (ref 6–23)
CO2: 26 mEq/L (ref 19–32)
Calcium: 9.2 mg/dL (ref 8.4–10.5)
Chloride: 103 mEq/L (ref 96–112)
Creatinine, Ser: 0.68 mg/dL (ref 0.40–1.20)
GFR: 86.18 mL/min (ref >60.00)
Glucose, Bld: 158 mg/dL — ABNORMAL HIGH (ref 70–99)
Potassium: 3.6 mEq/L (ref 3.5–5.1)
Sodium: 140 mEq/L (ref 135–145)
Total Bilirubin: 0.4 mg/dL (ref 0.2–1.2)
Total Protein: 7.3 g/dL (ref 6.0–8.3)

## 2019-09-06 LAB — HEMOGLOBIN A1C: Hgb A1c MFr Bld: 6.9 % — ABNORMAL HIGH (ref 4.6–6.5)

## 2019-09-06 LAB — VITAMIN D 25 HYDROXY (VIT D DEFICIENCY, FRACTURES): VITD: 23.11 ng/mL — ABNORMAL LOW (ref 30.00–100.00)

## 2019-09-06 MED ORDER — POTASSIUM CHLORIDE ER 10 MEQ PO CPCR: 20.0000 meq | ORAL_CAPSULE | Freq: Every day | ORAL | 3 refills | Status: DC

## 2019-09-06 NOTE — Patient Instructions (Addendum)
It was good to see you again today- take care and I will be in touch with your labs  You got your flu and tetanus boosters today  I would recommend that you have the shingles vaccine at your convenience at your pharmacy  Also, please come in for your pneumonia booster in later November or December   Please do work on getting a bit more exercise

## 2019-09-06 NOTE — Addendum Note (Signed)
Addended by: Lamar Blinks C on: 09/06/2019 09:22 PM   Modules accepted: Orders

## 2019-10-13 ENCOUNTER — Other Ambulatory Visit: Payer: Self-pay | Admitting: Family Medicine

## 2019-10-13 DIAGNOSIS — E119 Type 2 diabetes mellitus without complications: Secondary | ICD-10-CM

## 2019-12-06 ENCOUNTER — Encounter: Payer: Self-pay | Admitting: Family Medicine

## 2019-12-06 NOTE — Telephone Encounter (Signed)
Patient has been scheduled for pneumonia shot

## 2019-12-07 ENCOUNTER — Telehealth: Payer: Self-pay

## 2019-12-07 NOTE — Telephone Encounter (Signed)
Open in error

## 2019-12-12 ENCOUNTER — Other Ambulatory Visit: Payer: Self-pay

## 2019-12-12 ENCOUNTER — Ambulatory Visit (INDEPENDENT_AMBULATORY_CARE_PROVIDER_SITE_OTHER): Payer: Medicare Other

## 2019-12-12 DIAGNOSIS — Z23 Encounter for immunization: Secondary | ICD-10-CM | POA: Diagnosis not present

## 2019-12-12 NOTE — Progress Notes (Signed)
Patient here today for pneumonia vaccine 0.19mL given in patients right deltoid IM. VIS given. Patient tolerated well.

## 2020-01-02 ENCOUNTER — Other Ambulatory Visit: Payer: Self-pay | Admitting: Family Medicine

## 2020-01-02 DIAGNOSIS — I10 Essential (primary) hypertension: Secondary | ICD-10-CM

## 2020-01-29 DIAGNOSIS — E119 Type 2 diabetes mellitus without complications: Secondary | ICD-10-CM | POA: Diagnosis not present

## 2020-01-29 DIAGNOSIS — H524 Presbyopia: Secondary | ICD-10-CM | POA: Diagnosis not present

## 2020-01-29 DIAGNOSIS — H259 Unspecified age-related cataract: Secondary | ICD-10-CM | POA: Diagnosis not present

## 2020-03-18 ENCOUNTER — Encounter: Payer: Self-pay | Admitting: Family Medicine

## 2020-04-08 ENCOUNTER — Other Ambulatory Visit: Payer: Self-pay

## 2020-04-11 ENCOUNTER — Ambulatory Visit (INDEPENDENT_AMBULATORY_CARE_PROVIDER_SITE_OTHER): Payer: Medicare Other | Admitting: Family Medicine

## 2020-04-11 ENCOUNTER — Encounter: Payer: Self-pay | Admitting: Family Medicine

## 2020-04-11 ENCOUNTER — Other Ambulatory Visit: Payer: Self-pay

## 2020-04-11 VITALS — BP 155/90 | HR 84 | Temp 97.8°F | Resp 18 | Ht 63.0 in | Wt 215.0 lb

## 2020-04-11 DIAGNOSIS — E785 Hyperlipidemia, unspecified: Secondary | ICD-10-CM | POA: Diagnosis not present

## 2020-04-11 DIAGNOSIS — E876 Hypokalemia: Secondary | ICD-10-CM

## 2020-04-11 DIAGNOSIS — E2839 Other primary ovarian failure: Secondary | ICD-10-CM

## 2020-04-11 DIAGNOSIS — M79645 Pain in left finger(s): Secondary | ICD-10-CM

## 2020-04-11 DIAGNOSIS — M25571 Pain in right ankle and joints of right foot: Secondary | ICD-10-CM

## 2020-04-11 DIAGNOSIS — E1169 Type 2 diabetes mellitus with other specified complication: Secondary | ICD-10-CM | POA: Diagnosis not present

## 2020-04-11 DIAGNOSIS — G8929 Other chronic pain: Secondary | ICD-10-CM

## 2020-04-11 DIAGNOSIS — E118 Type 2 diabetes mellitus with unspecified complications: Secondary | ICD-10-CM | POA: Diagnosis not present

## 2020-04-11 DIAGNOSIS — E559 Vitamin D deficiency, unspecified: Secondary | ICD-10-CM

## 2020-04-11 DIAGNOSIS — I10 Essential (primary) hypertension: Secondary | ICD-10-CM

## 2020-04-11 DIAGNOSIS — Z1329 Encounter for screening for other suspected endocrine disorder: Secondary | ICD-10-CM

## 2020-04-11 DIAGNOSIS — Z1231 Encounter for screening mammogram for malignant neoplasm of breast: Secondary | ICD-10-CM

## 2020-04-11 LAB — COMPREHENSIVE METABOLIC PANEL
ALT: 33 U/L (ref 0–35)
AST: 22 U/L (ref 0–37)
Albumin: 4.2 g/dL (ref 3.5–5.2)
Alkaline Phosphatase: 51 U/L (ref 39–117)
BUN: 16 mg/dL (ref 6–23)
CO2: 27 mEq/L (ref 19–32)
Calcium: 9.3 mg/dL (ref 8.4–10.5)
Chloride: 105 mEq/L (ref 96–112)
Creatinine, Ser: 0.68 mg/dL (ref 0.40–1.20)
GFR: 86.03 mL/min (ref >60.00)
Glucose, Bld: 96 mg/dL (ref 70–99)
Potassium: 3.3 mEq/L — ABNORMAL LOW (ref 3.5–5.1)
Sodium: 141 mEq/L (ref 135–145)
Total Bilirubin: 0.5 mg/dL (ref 0.2–1.2)
Total Protein: 7.3 g/dL (ref 6.0–8.3)

## 2020-04-11 LAB — TSH: TSH: 2.95 u[IU]/mL (ref 0.35–4.50)

## 2020-04-11 LAB — LIPID PANEL
Cholesterol: 185 mg/dL (ref 0–200)
HDL: 48.1 mg/dL (ref >39.00)
LDL Cholesterol: 107 mg/dL — ABNORMAL HIGH (ref 0–99)
NonHDL: 137.08
Total CHOL/HDL Ratio: 4
Triglycerides: 152 mg/dL — ABNORMAL HIGH (ref 0.0–149.0)
VLDL: 30.4 mg/dL (ref 0.0–40.0)

## 2020-04-11 LAB — CBC
HCT: 39.5 % (ref 36.0–46.0)
Hemoglobin: 13.6 g/dL (ref 12.0–15.0)
MCHC: 34.4 g/dL (ref 30.0–36.0)
MCV: 84 fl (ref 78.0–100.0)
Platelets: 233 10*3/uL (ref 150.0–400.0)
RBC: 4.7 Mil/uL (ref 3.87–5.11)
RDW: 14.7 % (ref 11.5–15.5)
WBC: 11 10*3/uL — ABNORMAL HIGH (ref 4.0–10.5)

## 2020-04-11 LAB — VITAMIN D 25 HYDROXY (VIT D DEFICIENCY, FRACTURES): VITD: 43.54 ng/mL (ref 30.00–100.00)

## 2020-04-11 LAB — HEMOGLOBIN A1C: Hgb A1c MFr Bld: 7.3 % — ABNORMAL HIGH (ref 4.6–6.5)

## 2020-04-11 MED ORDER — NAPROXEN 500 MG PO TABS: 500.0000 mg | ORAL_TABLET | Freq: Two times a day (BID) | ORAL | 2 refills | Status: DC

## 2020-04-11 NOTE — Progress Notes (Addendum)
[place] at [facility] [address], [place], [place] [address] [phone]  Date:  04/11/2020   Name:  Anne Christensen   DOB:  08/01/1951   MRN:  [id number]  PCP:  Darreld Mclean, MD    Chief Complaint: Joint Pain (right foot pain, possibly diabetes? left hand pain, limited rom in thumb) and Diabetes   History of Present Illness:  Anne Christensen is a 69 y.o. very pleasant female patient who presents with the following:  Patient is here today for routine follow-up of hypertension, diabetes and hyperlipidemia, as well as concern of several joint pains Vitamin D deficiency  Last seen by myself in September At that time she was planning to retire over the next few months- she is now retired and overall enjoying it.  It is an adjustment  Her husband is semi-retired but still working some   She has noted right foot and left hand for 3 months-  She is still at her left 1st MCP joint/ base of the thumb. It may sometimes click and seem to go "out of joint"  She is not aware of any injury   Right foot/ankle: She also notes a "ball" just inferior to the lateral malleolus.  She is not aware of any specific injury to this ankle, though she did sprain her left ankle severely in the past    Lab Results  Component Value Date   HGBA1C 6.9 (H) 09/06/2019   Mammogram; she is due, she would like to order  Eye exam; she went in February, all ok per her report  Foot exam due- done today  A1c today Covid series Shingrix Colonoscopy up-to-date Dexa is due, order today   Amlodipine Losartan/HCTZ Metoprolol Metformin Potassium 20 mill equivalents daily  BP Readings from Last 3 Encounters:  04/11/20 (!) 155/90  09/06/19 (!) 144/88  02/15/19 138/80   Pulse Readings from Last 3 Encounters:  04/11/20 84  09/06/19 74  02/15/19 90    Patient Active Problem List   Diagnosis Date Noted  . Essential hypertension 07/25/2018  . Diabetes  mellitus type 2, controlled, without complications ([place]) 73/71/0626  . Epistaxis, recurrent 07/18/2013  . DJD (degenerative joint disease) 10/20/2011  . HYPERCHOLESTEROLEMIA 05/02/2008  . OVERWEIGHT 05/02/2008  . FATTY LIVER DISEASE 05/02/2008  . CHOLELITHIASIS 05/02/2008  . ASTHMA 05/01/2008  . BACK PAIN, LUMBAR 05/01/2008  . ALLERGY 05/01/2008    Past Medical History:  Diagnosis Date  . Acute bronchitis   . Allergy, unspecified not elsewhere classified   . Arthritis   . Asthma   . Bleeding nose   . Cholelithiasis   . Depression   . Diabetes mellitus type 2, controlled, without complications ([place]) 9/48/5462  . Fatty liver   . Hypercholesterolemia   . Hypertension   . Lumbar back pain   . Overweight(278.02)     Past Surgical History:  Procedure Laterality Date  . [place]  2001  . VESICOVAGINAL FISTULA CLOSURE W/ TAH  2000    Social History   Tobacco Use  . Smoking status: Never Smoker  . Smokeless tobacco: Never Used  Substance Use Topics  . Alcohol use: No  . Drug use: No    Family History  Problem Relation Age of Onset  . Asthma Unknown     Allergies  Allergen Reactions  . Penicillins     Swelling, rash, hives all over    Medication list has been reviewed and  updated.  Current Outpatient Medications on File Prior to Visit  Medication Sig Dispense Refill  . amLODipine (NORVASC) 5 MG tablet TAKE 1 TABLET BY MOUTH  DAILY 90 tablet 3  . fluticasone (FLONASE) 50 MCG/ACT nasal spray Place 2 sprays into both nostrils daily. 16 g 6  . loratadine (CLARITIN) 10 MG tablet Take 1 tablet (10 mg total) by mouth daily. 30 tablet 11  . losartan-hydrochlorothiazide (HYZAAR) 100-25 MG tablet TAKE 1 TABLET BY MOUTH  DAILY 90 tablet 3  . meclizine (ANTIVERT) 25 MG tablet Take 1 tablet (25 mg total) by mouth 3 (three) times daily as needed for dizziness. 30 tablet 0  . metFORMIN (GLUCOPHAGE) 500 MG tablet TAKE 2 TABLETS BY MOUTH  TWICE A DAY 360 tablet 3   . metoprolol succinate (TOPROL-XL) 25 MG 24 hr tablet TAKE 1 TABLET BY MOUTH  DAILY WITH OR IMMEDIATLEY  FOLLOWING A MEAL 90 tablet 3  . potassium chloride (MICRO-K) 10 MEQ CR capsule Take 2 capsules (20 mEq total) by mouth daily. 180 capsule 3   No current facility-administered medications on file prior to visit.    Review of Systems:  As per HPI- otherwise negative.   Physical Examination: Vitals:   04/11/20 1030 04/11/20 1105  BP: (!) 147/84 (!) 155/90  Pulse: 84   Resp: 18   Temp: 97.8 F (36.6 C)   SpO2: 98%    Vitals:   04/11/20 1030  Weight: 215 lb (97.5 kg)  Height: 5\' 3"  (1.6 m)   Body mass index is 38.09 kg/m. Ideal Body Weight: Weight in (lb) to have BMI = 25: 140.8  GEN: no acute distress.  Obese, otherwise looks well HEENT: Atraumatic, Normocephalic.  Ears and Nose: No external deformity. CV: RRR, No M/G/R. No JVD. No thrill. No extra heart sounds. PULM: CTA B, no wheezes, crackles, rhonchi. No retractions. No resp. distress. No accessory muscle use. ABD: S, NT, ND, +BS. No rebound. No HSM. EXTR: No c/c/e PSYCH: Normally interactive. Conversant.  Foot exam; normal pulses, sensation of both feet. She has trace edema of the dorsum of bilateral feet The right ankle is tender over the lateral aspect, just inferior to the malleolus.  There does seem to be a cystic structure or some type in this area as well, it is soft and mobile  Assessment and Plan: Essential hypertension - Plan: CBC, Comprehensive metabolic panel  Controlled type 2 diabetes mellitus with complication, without long-term current use of insulin (HCC) - Plan: Comprehensive metabolic panel, Hemoglobin A1c  Hypokalemia - Plan: Comprehensive metabolic panel  Hyperlipidemia associated with type 2 diabetes mellitus (HCC) - Plan: Lipid panel  Vitamin D deficiency - Plan: Vitamin D (25 hydroxy)  Screening for thyroid disorder - Plan: TSH  Encounter for screening mammogram for breast cancer -  Plan: MM 3D SCREEN BREAST BILATERAL  Estrogen deficiency - Plan: DG Bone Density  Chronic pain of left thumb - Plan: Ambulatory referral to Orthopedic Surgery, naproxen (NAPROSYN) 500 MG tablet  Chronic pain of right ankle - Plan: Ambulatory referral to Orthopedic Surgery, naproxen (NAPROSYN) 500 MG tablet  Here today for a follow-up visit.  We addressed the issues listed above It was good to see you again today, I will be in touch with your labs as soon as possible Assuming everything looks okay, we can plan to visit in 6 months It looks like you are due for a bone density scan and mammogram- these are ordered for you today!   I would also encourage  you to have the Shingrix vaccine and Covid series if not done yet  We will set you up to see orthopedics about your left thumb and right foot  Your BP is a bit high today- please take 2 of your metoprolol for the next 2 weeks and let me know how your BP and pulse respond. If your pulse is going lower than 60- 70 we may not be able to continue increased dose of metoprolol  This visit occurred during the SARS-CoV-2 public health emergency.  Safety protocols were in place, including screening questions prior to the visit, additional usage of staff PPE, and extensive cleaning of exam room while observing appropriate contact time as indicated for disinfecting solutions.   Moderate medical decision making today Signed Abbe Amsterdam, MD  Received her labs 4/16- message to pt  Results for orders placed or performed in visit on 04/11/20  CBC  Result Value Ref Range   WBC 11.0 (H) 4.0 - 10.5 K/uL   RBC 4.70 3.87 - 5.11 Mil/uL   Platelets 233.0 150.0 - 400.0 K/uL   Hemoglobin 13.6 12.0 - 15.0 g/dL   HCT 16.1 09.6 - 04.5 %   MCV 84.0 78.0 - 100.0 fl   MCHC 34.4 30.0 - 36.0 g/dL   RDW 40.9 81.1 - 91.4 %  Comprehensive metabolic panel  Result Value Ref Range   Sodium 141 135 - 145 mEq/L   Potassium 3.3 (L) 3.5 - 5.1 mEq/L   Chloride 105 96  - 112 mEq/L   CO2 27 19 - 32 mEq/L   Glucose, Bld 96 70 - 99 mg/dL   BUN 16 6 - 23 mg/dL   Creatinine, Ser 7.82 0.40 - 1.20 mg/dL   Total Bilirubin 0.5 0.2 - 1.2 mg/dL   Alkaline Phosphatase 51 39 - 117 U/L   AST 22 0 - 37 U/L   ALT 33 0 - 35 U/L   Total Protein 7.3 6.0 - 8.3 g/dL   Albumin 4.2 3.5 - 5.2 g/dL   GFR 95.62 >13.08 mL/min   Calcium 9.3 8.4 - 10.5 mg/dL  Hemoglobin M5H  Result Value Ref Range   Hgb A1c MFr Bld 7.3 (H) 4.6 - 6.5 %  Lipid panel  Result Value Ref Range   Cholesterol 185 0 - 200 mg/dL   Triglycerides 846.9 (H) 0.0 - 149.0 mg/dL   HDL 62.95 >28.41 mg/dL   VLDL 32.4 0.0 - 40.1 mg/dL   LDL Cholesterol 027 (H) 0 - 99 mg/dL   Total CHOL/HDL Ratio 4    NonHDL 137.08   TSH  Result Value Ref Range   TSH 2.95 0.35 - 4.50 uIU/mL  Vitamin D (25 hydroxy)  Result Value Ref Range   VITD 43.54 30.00 - 100.00 ng/mL    Minimal leukocytosis, she tends towards higher white blood cell count Last A1c 6.9

## 2020-04-11 NOTE — Patient Instructions (Addendum)
It was good to see you again today, I will be in touch with your labs as soon as possible Assuming everything looks okay, we can plan to visit in 6 months It looks like you are due for a bone density scan and mammogram- these are ordered for you today!   I would also encourage you to have the Shingrix vaccine and Covid series if not done yet  We will set you up to see orthopedics about your left thumb and right foot  Your BP is a bit high today- please take 2 of your metoprolol for the next 2 weeks and let me know how your BP and pulse respond. If your pulse is going lower than 60- 70 we may not be able to continue increased dose of metoprolol

## 2020-04-12 ENCOUNTER — Encounter: Payer: Self-pay | Admitting: Family Medicine

## 2020-04-22 ENCOUNTER — Ambulatory Visit (HOSPITAL_BASED_OUTPATIENT_CLINIC_OR_DEPARTMENT_OTHER): Payer: Medicare Other

## 2020-04-22 ENCOUNTER — Other Ambulatory Visit (HOSPITAL_BASED_OUTPATIENT_CLINIC_OR_DEPARTMENT_OTHER): Payer: Medicare Other

## 2020-06-11 ENCOUNTER — Encounter: Payer: Self-pay | Admitting: Family Medicine

## 2020-07-12 ENCOUNTER — Other Ambulatory Visit: Payer: Self-pay | Admitting: Family Medicine

## 2020-07-12 DIAGNOSIS — I10 Essential (primary) hypertension: Secondary | ICD-10-CM

## 2020-09-21 ENCOUNTER — Other Ambulatory Visit: Payer: Self-pay | Admitting: Family Medicine

## 2020-09-21 DIAGNOSIS — E119 Type 2 diabetes mellitus without complications: Secondary | ICD-10-CM

## 2020-10-15 DIAGNOSIS — E559 Vitamin D deficiency, unspecified: Secondary | ICD-10-CM | POA: Insufficient documentation

## 2020-10-15 NOTE — Progress Notes (Addendum)
[facility] [address] [place], [place] [address] [phone]  Date:  10/17/2020   Name:  Anne Christensen   DOB:  March 08, 1951   MRN:  [id number]  PCP:  Pearline Cables, MD    Chief Complaint: Diabetes and Hypertension   History of Present Illness:  Anne Christensen is a 69 y.o. very pleasant female patient who presents with the following:  Pt here today for a follow-up visit and flu shot-history of hypertension, hyperlipidemia, diabetes, vitamin D deficiency Last seen by myself in April of this year-most recent labs in April  Lab Results  Component Value Date   HGBA1C 7.3 (H) 04/11/2020   She has noted some higher blood sugars at home recently- she is not sure what her A1c will show but is concerned it may be high She does not generally check her BP at home but she does have a cough and is able to start monitoring  BP Readings from Last 3 Encounters:  10/17/20 135/80  04/11/20 (!) 155/90  09/06/19 (!) 144/88    COVID-19 series- done  Flu vaccine- done today  Foot exam- do today  Mammogram scheduled for late November Never smoker dexa scheduled as well with mammogram  Amlodipine 5 Losartan/HCTZ Metformin thousand once daily; this is listed as twice daily, but she confirms taking just once daily Toprol-XL 25 Potassium 20 mEq daily Patient Active Problem List   Diagnosis Date Noted  . Vitamin D deficiency 10/15/2020  . Essential hypertension 07/25/2018  . Diabetes mellitus type 2, controlled, without complications (HCC) 03/16/2016  . Epistaxis, recurrent 07/18/2013  . DJD (degenerative joint disease) 10/20/2011  . HYPERCHOLESTEROLEMIA 05/02/2008  . OVERWEIGHT 05/02/2008  . FATTY LIVER DISEASE 05/02/2008  . CHOLELITHIASIS 05/02/2008  . ASTHMA 05/01/2008  . BACK PAIN, LUMBAR 05/01/2008  . ALLERGY 05/01/2008    Past Medical History:  Diagnosis Date  . Acute bronchitis   . Allergy, unspecified not  elsewhere classified   . Arthritis   . Asthma   . Bleeding nose   . Cholelithiasis   . Depression   . Diabetes mellitus type 2, controlled, without complications (HCC) 03/16/2016  . Fatty liver   . Hypercholesterolemia   . Hypertension   . Lumbar back pain   . Overweight(278.02)     Past Surgical History:  Procedure Laterality Date  . INCISIONAL HERNIA REPAIR  2001  . VESICOVAGINAL FISTULA CLOSURE W/ TAH  2000    Social History   Tobacco Use  . Smoking status: Never Smoker  . Smokeless tobacco: Never Used  Substance Use Topics  . Alcohol use: No  . Drug use: No    Family History  Problem Relation Age of Onset  . Asthma Unknown     Allergies  Allergen Reactions  . Penicillins     Swelling, rash, hives all over    Medication list has been reviewed and updated.  Current Outpatient Medications on File Prior to Visit  Medication Sig Dispense Refill  . amLODipine (NORVASC) 5 MG tablet TAKE 1 TABLET BY MOUTH  DAILY 90 tablet 3  . fluticasone (FLONASE) 50 MCG/ACT nasal spray Place 2 sprays into both nostrils daily. 16 g 6  . loratadine (CLARITIN) 10 MG tablet Take 1 tablet (10 mg total) by mouth daily. 30 tablet 11  . losartan-hydrochlorothiazide (HYZAAR) 100-25 MG tablet TAKE 1 TABLET BY MOUTH  DAILY 90 tablet 3  . meclizine (ANTIVERT) 25 MG tablet Take 1  tablet (25 mg total) by mouth 3 (three) times daily as needed for dizziness. 30 tablet 0  . metFORMIN (GLUCOPHAGE) 500 MG tablet Take 2 tablets (1,000 mg total) by mouth 2 (two) times daily with a meal. 360 tablet 0  . metoprolol succinate (TOPROL-XL) 25 MG 24 hr tablet TAKE 1 TABLET BY MOUTH  DAILY WITH OR IMMEDIATLEY  FOLLOWING A MEAL 90 tablet 3  . naproxen (NAPROSYN) 500 MG tablet Take 1 tablet (500 mg total) by mouth 2 (two) times daily with a meal. Use as needed for joint pains 60 tablet 2  . potassium chloride (MICRO-K) 10 MEQ CR capsule TAKE 2 CAPSULES BY MOUTH  DAILY 180 capsule 3   No current  facility-administered medications on file prior to visit.    Review of Systems:  As per HPI- otherwise negative.   Physical Examination: Vitals:   10/17/20 0939 10/17/20 1001  BP: (!) 160/88 135/80  Pulse: 99   Resp: 16   Temp: 98.6 F (37 C)   SpO2: 96%    Vitals:   10/17/20 0939  Weight: 210 lb (95.3 kg)  Height: 5\' 2"  (1.575 m)   Body mass index is 38.41 kg/m. Ideal Body Weight: Weight in (lb) to have BMI = 25: 136.4  GEN: no acute distress.  Obese, otherwise looks well HEENT: Atraumatic, Normocephalic.   Bilateral TM wnl, oropharynx normal.  PEERL,EOMI.    Ears and Nose: No external deformity. CV: RRR, No M/G/R. No JVD. No thrill. No extra heart sounds. PULM: CTA B, no wheezes, crackles, rhonchi. No retractions. No resp. distress. No accessory muscle use. ABD: S, NT, ND, +BS. No rebound. No HSM. EXTR: No c/c/e PSYCH: Normally interactive. Conversant.  Foot exam completed today, normal  Assessment and Plan: Controlled type 2 diabetes mellitus with complication, without long-term current use of insulin (HCC) - Plan: Basic metabolic panel, Hemoglobin A1c, CANCELED: Basic metabolic panel, CANCELED: Hemoglobin A1c  Vitamin D deficiency - Plan: VITAMIN D 25 Hydroxy (Vit-D Deficiency, Fractures), CANCELED: VITAMIN D 25 Hydroxy (Vit-D Deficiency, Fractures)  Essential hypertension  Hyperlipidemia associated with type 2 diabetes mellitus (HCC)  Flu vaccine need - Plan: Flu Vaccine QUAD High Dose(Fluad)  Patient today to follow-up on diabetes control.  She has noted some higher blood sugars at home recently, we suspect her A1c may have gone up.  Await A1c report -will be in touch with her.  She notes that she has taken Januvia in the past and did well.  She is also willing to try an increased dose of Metformin Blood pressure slightly elevated today, better on recheck.  I have asked her to monitor her pressures at home and she will begin doing so Check vitamin D Flu  vaccine Will plan further follow- up pending labs.  This visit occurred during the SARS-CoV-2 public health emergency.  Safety protocols were in place, including screening questions prior to the visit, additional usage of staff PPE, and extensive cleaning of exam room while observing appropriate contact time as indicated for disinfecting solutions.   Signed , MD  10/22- received her labs as below  Results for orders placed or performed in visit on 10/17/20  Basic metabolic panel  Result Value Ref Range   Glucose, Bld 243 (H) 65 - 99 mg/dL   BUN 17 7 - 25 mg/dL   Creat 10/19/20 2.09 - 4.70 mg/dL   BUN/Creatinine Ratio NOT APPLICABLE 6 - 22 (calc)   Sodium 139 135 - 146 mmol/L   Potassium 3.7  3.5 - 5.3 mmol/L   Chloride 100 98 - 110 mmol/L   CO2 27 20 - 32 mmol/L   Calcium 9.7 8.6 - 10.4 mg/dL  Hemoglobin N5A  Result Value Ref Range   Hgb A1c MFr Bld 8.5 (H) <5.7 % of total Hgb   Mean Plasma Glucose 197 (calc)   eAG (mmol/L) 10.9 (calc)  VITAMIN D 25 Hydroxy (Vit-D Deficiency, Fractures)  Result Value Ref Range   Vit D, 25-Hydroxy 40 30 - 100 ng/mL   Message to pt

## 2020-10-15 NOTE — Patient Instructions (Addendum)
It was nice to see you again today, I will be in touch with your labs as soon as possible Assuming your A1c is a bit higher we will made a medication adjustment for you Please do monitor your BP a few times a month at home- if higher than 140/85 or so on a regular basis please alert me I will watch for your mammogram and bone density reports   Assuming all is well we will plan to visit in 4-6 months

## 2020-10-17 ENCOUNTER — Other Ambulatory Visit: Payer: Self-pay

## 2020-10-17 ENCOUNTER — Ambulatory Visit (INDEPENDENT_AMBULATORY_CARE_PROVIDER_SITE_OTHER): Payer: Medicare Other | Admitting: Family Medicine

## 2020-10-17 ENCOUNTER — Encounter: Payer: Self-pay | Admitting: Family Medicine

## 2020-10-17 VITALS — BP 135/80 | HR 99 | Temp 98.6°F | Resp 16 | Ht 62.0 in | Wt 210.0 lb

## 2020-10-17 DIAGNOSIS — E559 Vitamin D deficiency, unspecified: Secondary | ICD-10-CM

## 2020-10-17 DIAGNOSIS — I10 Essential (primary) hypertension: Secondary | ICD-10-CM

## 2020-10-17 DIAGNOSIS — E1169 Type 2 diabetes mellitus with other specified complication: Secondary | ICD-10-CM

## 2020-10-17 DIAGNOSIS — Z23 Encounter for immunization: Secondary | ICD-10-CM

## 2020-10-17 DIAGNOSIS — E118 Type 2 diabetes mellitus with unspecified complications: Secondary | ICD-10-CM | POA: Diagnosis not present

## 2020-10-17 DIAGNOSIS — E785 Hyperlipidemia, unspecified: Secondary | ICD-10-CM

## 2020-10-18 ENCOUNTER — Encounter: Payer: Self-pay | Admitting: Family Medicine

## 2020-10-18 LAB — BASIC METABOLIC PANEL
BUN: 17 mg/dL (ref 7–25)
CO2: 27 mmol/L (ref 20–32)
Calcium: 9.7 mg/dL (ref 8.6–10.4)
Chloride: 100 mmol/L (ref 98–110)
Creat: 0.82 mg/dL (ref 0.50–0.99)
Glucose, Bld: 243 mg/dL — ABNORMAL HIGH (ref 65–99)
Potassium: 3.7 mmol/L (ref 3.5–5.3)
Sodium: 139 mmol/L (ref 135–146)

## 2020-10-18 LAB — HEMOGLOBIN A1C
Hgb A1c MFr Bld: 8.5 % of total Hgb — ABNORMAL HIGH (ref <5.7)
Mean Plasma Glucose: 197 (calc)
eAG (mmol/L): 10.9 (calc)

## 2020-10-18 LAB — VITAMIN D 25 HYDROXY (VIT D DEFICIENCY, FRACTURES): Vit D, 25-Hydroxy: 40 ng/mL (ref 30–100)

## 2020-11-17 ENCOUNTER — Other Ambulatory Visit: Payer: Self-pay | Admitting: Family Medicine

## 2020-11-17 DIAGNOSIS — E119 Type 2 diabetes mellitus without complications: Secondary | ICD-10-CM

## 2020-11-18 ENCOUNTER — Encounter: Payer: Self-pay | Admitting: Family Medicine

## 2020-11-26 ENCOUNTER — Ambulatory Visit (HOSPITAL_BASED_OUTPATIENT_CLINIC_OR_DEPARTMENT_OTHER): Payer: Medicare Other

## 2020-11-26 ENCOUNTER — Other Ambulatory Visit (HOSPITAL_BASED_OUTPATIENT_CLINIC_OR_DEPARTMENT_OTHER): Payer: Medicare Other

## 2020-11-29 ENCOUNTER — Other Ambulatory Visit: Payer: Self-pay | Admitting: Family Medicine

## 2020-11-29 DIAGNOSIS — I10 Essential (primary) hypertension: Secondary | ICD-10-CM

## 2020-12-04 ENCOUNTER — Encounter: Payer: Self-pay | Admitting: Family Medicine

## 2020-12-05 ENCOUNTER — Ambulatory Visit (INDEPENDENT_AMBULATORY_CARE_PROVIDER_SITE_OTHER): Payer: Medicare Other | Admitting: Family Medicine

## 2020-12-05 ENCOUNTER — Encounter: Payer: Self-pay | Admitting: Family Medicine

## 2020-12-05 ENCOUNTER — Other Ambulatory Visit: Payer: Self-pay

## 2020-12-05 VITALS — BP 146/90 | HR 98 | Resp 17 | Ht 62.0 in | Wt 205.0 lb

## 2020-12-05 DIAGNOSIS — E118 Type 2 diabetes mellitus with unspecified complications: Secondary | ICD-10-CM | POA: Diagnosis not present

## 2020-12-05 DIAGNOSIS — R7309 Other abnormal glucose: Secondary | ICD-10-CM

## 2020-12-05 LAB — HEMOGLOBIN A1C: Hgb A1c MFr Bld: 9.2 % — ABNORMAL HIGH (ref 4.6–6.5)

## 2020-12-05 LAB — BASIC METABOLIC PANEL
BUN: 16 mg/dL (ref 6–23)
CO2: 28 mEq/L (ref 19–32)
Calcium: 9.8 mg/dL (ref 8.4–10.5)
Chloride: 96 mEq/L (ref 96–112)
Creatinine, Ser: 0.83 mg/dL (ref 0.40–1.20)
GFR: 72.28 mL/min (ref >60.00)
Glucose, Bld: 308 mg/dL — ABNORMAL HIGH (ref 70–99)
Potassium: 3.5 mEq/L (ref 3.5–5.1)
Sodium: 135 mEq/L (ref 135–145)

## 2020-12-05 LAB — POCT GLUCOSE (DEVICE FOR HOME USE): POC Glucose: 336 mg/dl — AB (ref 70–99)

## 2020-12-05 MED ORDER — ONETOUCH VERIO FLEX SYSTEM W/DEVICE KIT: PACK | 99 refills | Status: AC

## 2020-12-05 MED ORDER — TRESIBA FLEXTOUCH 200 UNIT/ML ~~LOC~~ SOPN: PEN_INJECTOR | SUBCUTANEOUS | 0 refills | Status: DC

## 2020-12-05 MED ORDER — TRULICITY 0.75 MG/0.5ML ~~LOC~~ SOAJ: 0.7500 mg | SUBCUTANEOUS | 3 refills | Status: DC

## 2020-12-05 MED ORDER — PEN NEEDLES 31G X 5 MM MISC: 5 refills | Status: AC

## 2020-12-05 NOTE — Patient Instructions (Addendum)
Good to see you again today!  We rx a new glucose meter for you to make sure we are getting accurate readings  Continue metformin  Start on long acting insulin=  Tresiba- 10 units once a day.  May increase by 2 units every 2 days until your am glucose readings are 200- 250  Once you have been on the [place] for 3-4 days take your first dose of Trulicity 0.75mg - we later increase to 1.5 mg assuming you do not go low  Please keep me closely posted about your glucose readings- please check once in the am before you eat, and then one other time during the day and keep a record  We will make adjustments as we get you under control

## 2020-12-05 NOTE — Telephone Encounter (Signed)
Added to schedule.

## 2020-12-05 NOTE — Progress Notes (Addendum)
[place] at [facility] [address] [place], [place] [address] [phone]  Date:  12/05/2020   Name:  Anne Christensen   DOB:  Nov 16, 1951   MRN:  [id number]  PCP:  Darreld Mclean, MD    Chief Complaint: Hyperglycemia (BS 424 at home last night/)   History of Present Illness:  Anne Christensen is a 69 y.o. very pleasant female patient who presents with the following:  History of DM on metformin- most recent A1c was mildly elevated but not out of hand  However more recently she has noted her glucose getting much higher - she got a reading of 424 last night.  She states that her glucose might be high, which caused her to begin checking more frequently.  She cannot define any particular symptoms of high glucose, she just feels that she can sense when this may be occurring  She used to run very high when she was first dx with DM, and she did use long-acting insulin for several months.  She was then able to keep her glucose under control with just Metformin She has also previously used Trulicity and tolerated well  Her current glucose meter is about 68 years old.  She thought perhaps it was not completely accurate anymore.  We checked against our meter, about 15 point discrepancy Results for orders placed or performed in visit on 12/05/20  POCT Glucose (Device for Home Use)  Result Value Ref Range   Glucose Fasting, POC     POC Glucose 336 (A) 70 - 99 mg/dl     Lab Results  Component Value Date   HGBA1C 8.5 (H) 10/17/2020     Patient Active Problem List   Diagnosis Date Noted  . Vitamin D deficiency 10/15/2020  . Essential hypertension 07/25/2018  . Diabetes mellitus type 2, controlled, without complications ([place]) 38/93/7342  . Epistaxis, recurrent 07/18/2013  . DJD (degenerative joint disease) 10/20/2011  . HYPERCHOLESTEROLEMIA 05/02/2008  . OVERWEIGHT 05/02/2008  . FATTY LIVER DISEASE 05/02/2008  . CHOLELITHIASIS 05/02/2008   . ASTHMA 05/01/2008  . BACK PAIN, LUMBAR 05/01/2008  . ALLERGY 05/01/2008    Past Medical History:  Diagnosis Date  . Acute bronchitis   . Allergy, unspecified not elsewhere classified   . Arthritis   . Asthma   . Bleeding nose   . Cholelithiasis   . Depression   . Diabetes mellitus type 2, controlled, without complications ([place]) 8/76/8115  . Fatty liver   . Hypercholesterolemia   . Hypertension   . Lumbar back pain   . Overweight(278.02)     Past Surgical History:  Procedure Laterality Date  . [place]  2001  . VESICOVAGINAL FISTULA CLOSURE W/ TAH  2000    Social History   Tobacco Use  . Smoking status: Never Smoker  . Smokeless tobacco: Never Used  Substance Use Topics  . Alcohol use: No  . Drug use: No    Family History  Problem Relation Age of Onset  . Asthma Unknown     Allergies  Allergen Reactions  . Penicillins     Swelling, rash, hives all over    Medication list has been reviewed and updated.  Current Outpatient Medications on File Prior to Visit  Medication Sig Dispense Refill  . amLODipine (NORVASC) 5 MG tablet Take 1 tablet (5 mg total) by mouth daily. 90 tablet 1  . losartan-hydrochlorothiazide (HYZAAR) 100-25 MG tablet Take 1 tablet by mouth daily.  90 tablet 1  . metFORMIN (GLUCOPHAGE) 500 MG tablet Take 1,000 mg by mouth daily.    . metoprolol succinate (TOPROL-XL) 25 MG 24 hr tablet Take 1 tablet (25 mg total) by mouth daily. Take with or immediately following a meal 90 tablet 1  . potassium chloride (MICRO-K) 10 MEQ CR capsule TAKE 2 CAPSULES BY MOUTH  DAILY 180 capsule 3   No current facility-administered medications on file prior to visit.    Review of Systems:  As per HPI- otherwise negative.   Physical Examination: Vitals:   12/05/20 1117  BP: (!) 146/90  Pulse: 98  Resp: 17  SpO2: 98%   Vitals:   12/05/20 1117  Weight: 205 lb (93 kg)  Height: 5' 2"  (1.575 m)   Body mass index is 37.49  kg/m. Ideal Body Weight: Weight in (lb) to have BMI = 25: 136.4  GEN: no acute distress.  Obese, otherwise looks well HEENT: Atraumatic, Normocephalic.  Ears and Nose: No external deformity. CV: RRR, No M/G/R. No JVD. No thrill. No extra heart sounds. PULM: CTA B, no wheezes, crackles, rhonchi. No retractions. No resp. distress. No accessory muscle use. EXTR: No c/c/e PSYCH: Normally interactive. Conversant.   Results for orders placed or performed in visit on 12/05/20  POCT Glucose (Device for Home Use)  Result Value Ref Range   Glucose Fasting, POC     POC Glucose 336 (A) 70 - 99 mg/dl   - Assessment and Plan: Abnormal glucose - Plan: POCT Glucose (Device for Home Use), Basic metabolic panel, Hemoglobin A1c, Blood Glucose Monitoring Suppl (ONETOUCH VERIO FLEX SYSTEM) w/Device KIT, Dulaglutide (TRULICITY) 7.85 YI/5.0YD SOPN, Insulin Pen Needle (PEN NEEDLES) 31G X 5 MM MISC  Controlled type 2 diabetes mellitus with complication, without long-term current use of insulin ([place])  Patient is here today to discuss diabetes.  She is typically had reasonable control of her diabetes using just Metformin.  However, over the last few weeks she has noted significantly higher blood sugars culminating in glucose over 400 yesterday.  Today her glucose is about 330.  We will have her start on insulin degludec-Tresiba-10 units daily.  We will have her gradually increase until fasting sugars are 200-2 50.  We will also have her start on Trulicity at 7.41-OINO to increase to 1.5 later.  We will can have her on insulin just temporarily as she typically has had well-controlled sugars and weight gain is a concern.  I have asked her to keep me closely updated about her daily glucose readings, she agrees to do so.  We will dynamically adjust her insulin and Trulicity doses as needed  Will plan further follow- up pending labs. This visit occurred during the SARS-CoV-2 public health emergency.  Safety protocols  were in place, including screening questions prior to the visit, additional usage of staff PPE, and extensive cleaning of exam room while observing appropriate contact time as indicated for disinfecting solutions.     Signed Lamar Blinks, MD  addnd 12/11- received A1c as below, message to pt  Results for orders placed or performed in visit on 67/67/20  Basic metabolic panel  Result Value Ref Range   Sodium 135 135 - 145 mEq/L   Potassium 3.5 3.5 - 5.1 mEq/L   Chloride 96 96 - 112 mEq/L   CO2 28 19 - 32 mEq/L   Glucose, Bld 308 (H) 70 - 99 mg/dL   BUN 16 6 - 23 mg/dL   Creatinine, Ser 0.83 0.40 - 1.20 mg/dL  GFR 72.28 >60.00 mL/min   Calcium 9.8 8.4 - 10.5 mg/dL  Hemoglobin A1c  Result Value Ref Range   Hgb A1c MFr Bld 9.2 (H) 4.6 - 6.5 %  POCT Glucose (Device for Home Use)  Result Value Ref Range   Glucose Fasting, POC     POC Glucose 336 (A) 70 - 99 mg/dl

## 2020-12-05 NOTE — Progress Notes (Signed)
Checked glucose in office. Patient states she ate cereal at around 5:30 AM. Glucose in office with my monitor reads 336. Compared to her monitor glucose read 315.    Patient was given Trulicity 0.75mg  Lot #[id number] EXP 01/07/2022 sample and tresiba sample 200 units [id number] EXP 4/23. 10 units given in office for patients first dose under authorization of provider.

## 2020-12-07 ENCOUNTER — Encounter: Payer: Self-pay | Admitting: Family Medicine

## 2020-12-07 DIAGNOSIS — R7309 Other abnormal glucose: Secondary | ICD-10-CM

## 2020-12-09 MED ORDER — TRULICITY 1.5 MG/0.5ML ~~LOC~~ SOAJ: 1.5000 mg | SUBCUTANEOUS | 3 refills | Status: DC

## 2021-02-20 ENCOUNTER — Encounter: Payer: Self-pay | Admitting: Family Medicine

## 2021-02-20 DIAGNOSIS — I10 Essential (primary) hypertension: Secondary | ICD-10-CM

## 2021-02-20 MED ORDER — LOSARTAN POTASSIUM-HCTZ 100-25 MG PO TABS: 1.0000 | ORAL_TABLET | Freq: Every day | ORAL | 1 refills | Status: DC

## 2021-02-24 DIAGNOSIS — E119 Type 2 diabetes mellitus without complications: Secondary | ICD-10-CM | POA: Diagnosis not present

## 2021-03-10 ENCOUNTER — Other Ambulatory Visit: Payer: Self-pay

## 2021-03-10 ENCOUNTER — Ambulatory Visit (HOSPITAL_BASED_OUTPATIENT_CLINIC_OR_DEPARTMENT_OTHER)
Admission: RE | Admit: 2021-03-10 | Discharge: 2021-03-10 | Disposition: A | Discharge status: Home / Self Care | Payer: Medicare Other | Source: Ambulatory Visit | Attending: Family Medicine | Admitting: Family Medicine

## 2021-03-10 ENCOUNTER — Encounter (HOSPITAL_BASED_OUTPATIENT_CLINIC_OR_DEPARTMENT_OTHER): Payer: Self-pay

## 2021-03-10 DIAGNOSIS — E2839 Other primary ovarian failure: Secondary | ICD-10-CM | POA: Diagnosis present

## 2021-03-10 DIAGNOSIS — Z780 Asymptomatic menopausal state: Secondary | ICD-10-CM | POA: Diagnosis not present

## 2021-03-10 DIAGNOSIS — Z1231 Encounter for screening mammogram for malignant neoplasm of breast: Secondary | ICD-10-CM | POA: Insufficient documentation

## 2021-03-11 ENCOUNTER — Encounter: Payer: Self-pay | Admitting: Family Medicine

## 2021-05-06 ENCOUNTER — Other Ambulatory Visit: Payer: Self-pay | Admitting: Family Medicine

## 2021-05-15 ENCOUNTER — Other Ambulatory Visit: Payer: Self-pay | Admitting: Family Medicine

## 2021-05-15 DIAGNOSIS — I10 Essential (primary) hypertension: Secondary | ICD-10-CM

## 2021-06-16 ENCOUNTER — Other Ambulatory Visit: Payer: Self-pay | Admitting: Family Medicine

## 2021-06-16 DIAGNOSIS — I10 Essential (primary) hypertension: Secondary | ICD-10-CM

## 2021-07-13 ENCOUNTER — Other Ambulatory Visit: Payer: Self-pay | Admitting: Family Medicine

## 2021-07-13 DIAGNOSIS — I10 Essential (primary) hypertension: Secondary | ICD-10-CM

## 2021-07-18 ENCOUNTER — Telehealth: Payer: Self-pay

## 2021-07-18 NOTE — Telephone Encounter (Signed)
CCM pharmacists called stating patient needs an appontment with PCP since she hasnt been seen all year

## 2021-07-18 NOTE — Telephone Encounter (Signed)
Sent patient mychart msg.

## 2021-08-15 ENCOUNTER — Telehealth: Payer: Self-pay | Admitting: *Deleted

## 2021-08-15 NOTE — Chronic Care Management (AMB) (Signed)
  Chronic Care Management   Outreach Note  08/15/2021 Name: Earle Burson MRN: [id number] DOB: [birth year]  Trenyce Loera is a 70 y.o. year old female who is a primary care patient of Copland, Gwenlyn Found, MD. I reached out to Adalberto Ill by phone today in response to a referral sent by Ms. Bonita Quin Elliff's PCP, Copland, Gwenlyn Found, MD      An unsuccessful telephone outreach was attempted today. The patient was referred to the case management team for assistance with care management and care coordination.   Follow Up Plan: A HIPAA compliant phone message was left for the patient providing contact information and requesting a return call. The care management team will reach out to the patient again over the next 7 days.  If patient returns call to provider office, please advise to call Embedded Care Management Care Guide Misty Stanley at [phone].  Gwenevere Ghazi  Care Guide, Embedded Care Coordination [facility] Management  Direct Dial: [phone]

## 2021-08-22 NOTE — Chronic Care Management (AMB) (Signed)
  Chronic Care Management   Note  08/22/2021 Name: Anne Christensen MRN: [id number] DOB: 23-Mar-1951  Anne Christensen is a 70 y.o. year old female who is a primary care patient of Copland, Gay Filler, MD. I reached out to Teofilo Pod by phone today in response to a referral sent by [name]'s PCP, Dr. Lorelei Pont      [name] was given information about Chronic Care Management services today including:  CCM service includes personalized support from designated clinical staff supervised by her physician, including individualized plan of care and coordination with other care providers 24/7 contact phone numbers for assistance for urgent and routine care needs. Service will only be billed when office clinical staff spend 20 minutes or more in a month to coordinate care. Only one practitioner may furnish and bill the service in a calendar month. The patient may stop CCM services at any time (effective at the end of the month) by phone call to the office staff. The patient will be responsible for cost sharing (co-pay) of up to 20% of the service fee (after annual deductible is met).  Patient did not agree to enrollment in care management services and does not wish to consider at this time.  Follow up plan: Patient declines engagement by the care management team. Appropriate care team members and provider have been notified via electronic communication. The care management team is available to follow up with the patient after provider conversation with the patient regarding recommendation for care management engagement and subsequent re-referral to the care management team.   [facility]  Direct Dial: [phone]

## 2021-09-09 ENCOUNTER — Other Ambulatory Visit: Payer: Self-pay | Admitting: Family Medicine

## 2021-09-09 DIAGNOSIS — I10 Essential (primary) hypertension: Secondary | ICD-10-CM

## 2021-09-12 ENCOUNTER — Telehealth: Payer: Self-pay | Admitting: Family Medicine

## 2021-09-12 NOTE — Telephone Encounter (Signed)
Anne Christensen from [employer] pt has diabetes and not currently on statin meds. Per the 2020 ada and accaha cholesterol guidelines recommendation they would like for pt to be reevaluated and prescribed statin meds. If any new questions develop please contact number listed below.   [employer]: [phone]

## 2021-09-12 NOTE — Telephone Encounter (Signed)
Looks like you all had a discussion about this via MyChart in 06/15//2021.

## 2021-09-16 NOTE — Telephone Encounter (Signed)
Pt has declined statins in the past.

## 2021-10-10 NOTE — Patient Instructions (Addendum)
It was very nice to see you again today, I will be in touch with your labs ASAP.  Assuming all is well please see me in about 6 months We will check on yoru A1c and see if any other treatment is needed for your diabetes.  You seem to have some mild neuropathy symptoms due to diabetes.  If you develop pain let me know   In case of bee sting- take 1-2 benadryl tablets asap In case of emergency use the epipen   Great job with weight loss!    Plan for shingles vaccine next year Flu shot today I would recommend getting the new covid vaccine at your pharmacy  Let's have you start back on metoprolol to bring your BP back down

## 2021-10-10 NOTE — Progress Notes (Signed)
[place] at [facility] [address], [place], [place] [address] [phone]  Date:  10/15/2021   Name:  Anne Christensen   DOB:  February 24, 1951   MRN:  [id number]  PCP:  Darreld Mclean, MD    Chief Complaint: Follow-up (Concerns/ questions: 1. pt says will only have a $30/30 days copay on Tresiba at the beginning of the year. 2. Not using right now d/t cost. Also not taking Metoprolol. 3. Refill on naproxen. 4. 3 stings on 3 different occasions. She is now is concerned for allergy to yellow jacket./Flu shot today: yes/Eye Exam: dr Chauncey Mann [facility] )   History of Present Illness:  Anne Christensen is a 70 y.o. very pleasant female patient who presents with the following:  Patient seen today for follow-up and flu vaccine Most recent visit with myself December of last year  History of diabetes, hypertension, hyperlipidemia, fatty liver, overweight, asthma and allergies  She is taking just metformin right now due to cost- she can go back on trulicity next year as her copay will go down She is taking 1000 mg of metformin ONCE a day right now  Shingrix- recommended, she plans to do this next year  COVID booster Eye exam up-to-date Flu vaccine today Due for labs Mammogram up-to-date DEXA up-to-date Colonoscopy up-to-date  Labs today  Amlodipine 10 Losartan HCTZ Toprol-XL 25- NOT taking right now  Potassium 10 M EQ, 2 daily Trulicity- NOT taking Metformin Tyler Aas FlexPen-NOT taking   She does have some symptoms of neuropathy, note that her feet may feel numb or like the bottoms of her feet are swollen.  She is not having any pain however Patient Active Problem List   Diagnosis Date Noted   Vitamin D deficiency 10/15/2020   Essential hypertension 07/25/2018   Diabetes mellitus type 2, controlled, without complications ([place]) 99/35/7017   Epistaxis, recurrent 07/18/2013   DJD (degenerative joint disease) 10/20/2011    HYPERCHOLESTEROLEMIA 05/02/2008   OVERWEIGHT 05/02/2008   FATTY LIVER DISEASE 05/02/2008   CHOLELITHIASIS 05/02/2008   ASTHMA 05/01/2008   BACK PAIN, LUMBAR 05/01/2008   ALLERGY 05/01/2008    Past Medical History:  Diagnosis Date   Acute bronchitis    Allergy, unspecified not elsewhere classified    Arthritis    Asthma    Bleeding nose    Cholelithiasis    Depression    Diabetes mellitus type 2, controlled, without complications ([place]) 7/93/9030   Fatty liver    Hypercholesterolemia    Hypertension    Lumbar back pain    Overweight(278.02)     Past Surgical History:  Procedure Laterality Date   INCISIONAL HERNIA REPAIR  2001   VESICOVAGINAL FISTULA CLOSURE W/ TAH  2000    Social History   Tobacco Use   Smoking status: Never   Smokeless tobacco: Never  Substance Use Topics   Alcohol use: No   Drug use: No    Family History  Problem Relation Age of Onset   Asthma Other     Allergies  Allergen Reactions   Penicillins     Swelling, rash, hives all over    Medication list has been reviewed and updated.  Current Outpatient Medications on File Prior to Visit  Medication Sig Dispense Refill   amLODipine (NORVASC) 5 MG tablet TAKE 1 TABLET BY MOUTH  DAILY 90 tablet 3   Blood Glucose Monitoring Suppl (Esto) w/Device KIT Use to check glucose as  needed 1 kit prn   Insulin Pen Needle (PEN NEEDLES) 31G X 5 MM MISC Use as needed to give injection 100 each 5   losartan-hydrochlorothiazide (HYZAAR) 100-25 MG tablet TAKE 1 TABLET BY MOUTH  DAILY 90 tablet 3   metFORMIN (GLUCOPHAGE) 500 MG tablet TAKE 2 TABLETS BY MOUTH  TWICE DAILY WITH A MEAL 360 tablet 1   potassium chloride (MICRO-K) 10 MEQ CR capsule TAKE 2 CAPSULES BY MOUTH  DAILY 180 capsule 0   No current facility-administered medications on file prior to visit.    Review of Systems:  As per HPI- otherwise negative.  BP Readings from Last 3 Encounters:  10/15/21 (!) 156/94  12/05/20  (!) 146/90  10/17/20 135/80   Wt Readings from Last 3 Encounters:  10/15/21 196 lb 3.2 oz (89 kg)  12/05/20 205 lb (93 kg)  10/17/20 210 lb (95.3 kg)      Physical Examination: Vitals:   10/15/21 0949  BP: (!) 156/94  Pulse: (!) 106  Resp: 18  Temp: 98.4 F (36.9 C)  SpO2: 98%   Vitals:   10/15/21 0949  Weight: 196 lb 3.2 oz (89 kg)  Height: _0  (1.575 m)   Body mass index is 35.89 kg/m. Ideal Body Weight: Weight in (lb) to have BMI = 25: 136.4  GEN: no acute distress.  Obese, looks well  HEENT: Atraumatic, Normocephalic.  Ears and Nose: No external deformity. CV: RRR, No M/G/R. No JVD. No thrill. No extra heart sounds. PULM: CTA B, no wheezes, crackles, rhonchi. No retractions. No resp. distress. No accessory muscle use. ABD: S, NT, ND, +BS. No rebound. No HSM. EXTR: No c/c/e PSYCH: Normally interactive. Conversant.    Assessment and Plan: Essential hypertension - Plan: CBC, Comprehensive metabolic panel, potassium chloride (MICRO-K) 10 MEQ CR capsule, metoprolol succinate (TOPROL-XL) 25 MG 24 hr tablet  Controlled type 2 diabetes mellitus with complication, without long-term current use of insulin ([place]) - Plan: Comprehensive metabolic panel, Hemoglobin A1c  Hyperlipidemia associated with type 2 diabetes mellitus ([place]) - Plan: Lipid panel  Vitamin D deficiency - Plan: VITAMIN D 25 Hydroxy (Vit-D Deficiency, Fractures)  Hypokalemia - Plan: Comprehensive metabolic panel  Screening for thyroid disorder - Plan: TSH  Left hip pain - Plan: naproxen (NAPROSYN) 500 MG tablet  Bee sting allergy - Plan: EPINEPHrine (EPIPEN 2-PAK) 0.3 mg/0.3 mL IJ SOAJ injection  Neuropathy associated with endocrine disorder ([place])  Need for influenza vaccination - Plan: Flu Vaccine QUAD High Dose(Fluad)  Patient seen today for follow-up.  She is not currently taking her metoprolol, noted tachycardia and elevated blood pressure.  We will have her start back on metoprolol 25 She is  not currently taking insulin or Trulicity, just metformin Await routine labs as above Prescribed EpiPen to use in case of bee sting, counseled patient that first step is to take Benadryl.  If she develops any distress or angioedema call 911 and use EpiPen  Signed Lamar Blinks, MD  Received her labs as below, message to patient  Results for orders placed or performed in visit on 10/15/21  CBC  Result Value Ref Range   WBC 10.1 4.0 - 10.5 K/uL   RBC 5.06 3.87 - 5.11 Mil/uL   Platelets 263.0 150.0 - 400.0 K/uL   Hemoglobin 14.2 12.0 - 15.0 g/dL   HCT 42.8 36.0 - 46.0 %   MCV 84.6 78.0 - 100.0 fl   MCHC 33.2 30.0 - 36.0 g/dL   RDW 14.4 11.5 - 15.5 %  Comprehensive metabolic panel  Result Value Ref Range   Sodium 140 135 - 145 mEq/L   Potassium 3.8 3.5 - 5.1 mEq/L   Chloride 102 96 - 112 mEq/L   CO2 27 19 - 32 mEq/L   Glucose, Bld 115 (H) 70 - 99 mg/dL   BUN 20 6 - 23 mg/dL   Creatinine, Ser 0.85 0.40 - 1.20 mg/dL   Total Bilirubin 0.6 0.2 - 1.2 mg/dL   Alkaline Phosphatase 49 39 - 117 U/L   AST 19 0 - 37 U/L   ALT 24 0 - 35 U/L   Total Protein 7.7 6.0 - 8.3 g/dL   Albumin 4.3 3.5 - 5.2 g/dL   GFR 69.82 >60.00 mL/min   Calcium 10.1 8.4 - 10.5 mg/dL  Hemoglobin A1c  Result Value Ref Range   Hgb A1c MFr Bld 6.9 (H) 4.6 - 6.5 %  Lipid panel  Result Value Ref Range   Cholesterol 216 (H) 0 - 200 mg/dL   Triglycerides 204.0 (H) 0.0 - 149.0 mg/dL   HDL 51.20 >39.00 mg/dL   VLDL 40.8 (H) 0.0 - 40.0 mg/dL   Total CHOL/HDL Ratio 4    NonHDL 164.65   TSH  Result Value Ref Range   TSH 3.24 0.35 - 5.50 uIU/mL  VITAMIN D 25 Hydroxy (Vit-D Deficiency, Fractures)  Result Value Ref Range   VITD 23.14 (L) 30.00 - 100.00 ng/mL  LDL cholesterol, direct  Result Value Ref Range   Direct LDL 146.0 mg/dL

## 2021-10-15 ENCOUNTER — Other Ambulatory Visit: Payer: Self-pay

## 2021-10-15 ENCOUNTER — Ambulatory Visit (INDEPENDENT_AMBULATORY_CARE_PROVIDER_SITE_OTHER): Payer: Medicare Other | Admitting: Family Medicine

## 2021-10-15 ENCOUNTER — Encounter: Payer: Self-pay | Admitting: Family Medicine

## 2021-10-15 VITALS — BP 156/94 | HR 106 | Temp 98.4°F | Resp 18 | Ht 62.0 in | Wt 196.2 lb

## 2021-10-15 DIAGNOSIS — Z91030 Bee allergy status: Secondary | ICD-10-CM

## 2021-10-15 DIAGNOSIS — E1169 Type 2 diabetes mellitus with other specified complication: Secondary | ICD-10-CM

## 2021-10-15 DIAGNOSIS — E118 Type 2 diabetes mellitus with unspecified complications: Secondary | ICD-10-CM

## 2021-10-15 DIAGNOSIS — Z23 Encounter for immunization: Secondary | ICD-10-CM

## 2021-10-15 DIAGNOSIS — E785 Hyperlipidemia, unspecified: Secondary | ICD-10-CM | POA: Diagnosis not present

## 2021-10-15 DIAGNOSIS — I10 Essential (primary) hypertension: Secondary | ICD-10-CM

## 2021-10-15 DIAGNOSIS — M25552 Pain in left hip: Secondary | ICD-10-CM | POA: Diagnosis not present

## 2021-10-15 DIAGNOSIS — G63 Polyneuropathy in diseases classified elsewhere: Secondary | ICD-10-CM

## 2021-10-15 DIAGNOSIS — E349 Endocrine disorder, unspecified: Secondary | ICD-10-CM | POA: Insufficient documentation

## 2021-10-15 DIAGNOSIS — Z1329 Encounter for screening for other suspected endocrine disorder: Secondary | ICD-10-CM | POA: Diagnosis not present

## 2021-10-15 DIAGNOSIS — E876 Hypokalemia: Secondary | ICD-10-CM

## 2021-10-15 DIAGNOSIS — E559 Vitamin D deficiency, unspecified: Secondary | ICD-10-CM | POA: Diagnosis not present

## 2021-10-15 LAB — CBC
HCT: 42.8 % (ref 36.0–46.0)
Hemoglobin: 14.2 g/dL (ref 12.0–15.0)
MCHC: 33.2 g/dL (ref 30.0–36.0)
MCV: 84.6 fl (ref 78.0–100.0)
Platelets: 263 10*3/uL (ref 150.0–400.0)
RBC: 5.06 Mil/uL (ref 3.87–5.11)
RDW: 14.4 % (ref 11.5–15.5)
WBC: 10.1 10*3/uL (ref 4.0–10.5)

## 2021-10-15 LAB — LIPID PANEL
Cholesterol: 216 mg/dL — ABNORMAL HIGH (ref 0–200)
HDL: 51.2 mg/dL (ref >39.00)
NonHDL: 164.65
Total CHOL/HDL Ratio: 4
Triglycerides: 204 mg/dL — ABNORMAL HIGH (ref 0.0–149.0)
VLDL: 40.8 mg/dL — ABNORMAL HIGH (ref 0.0–40.0)

## 2021-10-15 LAB — COMPREHENSIVE METABOLIC PANEL
ALT: 24 U/L (ref 0–35)
AST: 19 U/L (ref 0–37)
Albumin: 4.3 g/dL (ref 3.5–5.2)
Alkaline Phosphatase: 49 U/L (ref 39–117)
BUN: 20 mg/dL (ref 6–23)
CO2: 27 mEq/L (ref 19–32)
Calcium: 10.1 mg/dL (ref 8.4–10.5)
Chloride: 102 mEq/L (ref 96–112)
Creatinine, Ser: 0.85 mg/dL (ref 0.40–1.20)
GFR: 69.82 mL/min (ref >60.00)
Glucose, Bld: 115 mg/dL — ABNORMAL HIGH (ref 70–99)
Potassium: 3.8 mEq/L (ref 3.5–5.1)
Sodium: 140 mEq/L (ref 135–145)
Total Bilirubin: 0.6 mg/dL (ref 0.2–1.2)
Total Protein: 7.7 g/dL (ref 6.0–8.3)

## 2021-10-15 LAB — VITAMIN D 25 HYDROXY (VIT D DEFICIENCY, FRACTURES): VITD: 23.14 ng/mL — ABNORMAL LOW (ref 30.00–100.00)

## 2021-10-15 LAB — TSH: TSH: 3.24 u[IU]/mL (ref 0.35–5.50)

## 2021-10-15 LAB — HEMOGLOBIN A1C: Hgb A1c MFr Bld: 6.9 % — ABNORMAL HIGH (ref 4.6–6.5)

## 2021-10-15 LAB — LDL CHOLESTEROL, DIRECT: Direct LDL: 146 mg/dL

## 2021-10-15 MED ORDER — EPINEPHRINE 0.3 MG/0.3ML IJ SOAJ: 0.3000 mg | INTRAMUSCULAR | 99 refills | Status: AC | PRN

## 2021-10-15 MED ORDER — METOPROLOL SUCCINATE ER 25 MG PO TB24: 25.0000 mg | ORAL_TABLET | Freq: Every day | ORAL | 3 refills | Status: DC

## 2021-10-15 MED ORDER — NAPROXEN 500 MG PO TABS: 500.0000 mg | ORAL_TABLET | Freq: Two times a day (BID) | ORAL | 1 refills | Status: DC

## 2021-10-15 MED ORDER — POTASSIUM CHLORIDE ER 10 MEQ PO CPCR: 20.0000 meq | ORAL_CAPSULE | Freq: Every day | ORAL | 3 refills | Status: DC

## 2021-10-23 ENCOUNTER — Other Ambulatory Visit: Payer: Self-pay | Admitting: Family Medicine

## 2021-11-25 ENCOUNTER — Encounter: Payer: Self-pay | Admitting: Family Medicine

## 2021-11-25 ENCOUNTER — Other Ambulatory Visit: Payer: Self-pay | Admitting: Family Medicine

## 2021-11-25 DIAGNOSIS — M25552 Pain in left hip: Secondary | ICD-10-CM

## 2022-01-12 DIAGNOSIS — H43811 Vitreous degeneration, right eye: Secondary | ICD-10-CM | POA: Diagnosis not present

## 2022-03-26 DIAGNOSIS — E119 Type 2 diabetes mellitus without complications: Secondary | ICD-10-CM | POA: Diagnosis not present

## 2022-04-08 ENCOUNTER — Other Ambulatory Visit: Payer: Self-pay | Admitting: Family Medicine

## 2022-04-17 ENCOUNTER — Other Ambulatory Visit: Payer: Self-pay | Admitting: Family Medicine

## 2022-04-17 DIAGNOSIS — I10 Essential (primary) hypertension: Secondary | ICD-10-CM

## 2022-04-23 ENCOUNTER — Encounter: Payer: Self-pay | Admitting: *Deleted

## 2022-05-19 ENCOUNTER — Other Ambulatory Visit: Payer: Self-pay | Admitting: Family Medicine

## 2022-05-19 DIAGNOSIS — I10 Essential (primary) hypertension: Secondary | ICD-10-CM

## 2022-05-19 NOTE — Telephone Encounter (Signed)
Received metoprolol request.  Pt is past due for 6 month follow up with Dr Patsy Lager. Attempted to reach pt by phone and received message that call was being screened then unable to be answered and call was disconnected. Sent mychart message to pt that Rx request is on hold until we can get her follow up appointment scheduled.

## 2022-05-21 NOTE — Telephone Encounter (Signed)
Pt has not read "mychart" message from 5/23. Attempted to reach pt by phone and received message that call was being screened and had been rejected then disconnected.  Sent 90 day supply of metoprolol and mailed letter to pt.  Pt is past due for 6 month follow up and further refills may not be given until pt is seen by Provider.

## 2022-05-28 DIAGNOSIS — E119 Type 2 diabetes mellitus without complications: Secondary | ICD-10-CM | POA: Diagnosis not present

## 2022-05-28 DIAGNOSIS — H43811 Vitreous degeneration, right eye: Secondary | ICD-10-CM | POA: Diagnosis not present

## 2022-05-28 LAB — HM DIABETES EYE EXAM

## 2022-06-07 NOTE — Progress Notes (Addendum)
[place] at [facility] [address] [place], [place] [address] [phone]  Date:  06/10/2022   Name:  Anne Christensen   DOB:  [birth year]   MRN:  [id number]  PCP:  Anne Mclean, MD    Chief Complaint: Follow-up (Concerns/ questions: bumps under the skin on the Right arm/Shingrix: none received. She asks if she can receive this here today. Anne Christensen: [place], [place]- 2 weeks ago./Foot Christensen due today)   History of Present Illness:  Anne Christensen is a 71 y.o. very pleasant female patient who presents with the following:  Patient seen today for follow-up Her husband Anne Christensen died last 12-19-2023 after 49 years of marriage She has a couple of close friends nearby She has actually started a new relationship with someone new- this is really helping her state of mind  She is not feeling depressed  Most recent visit with myself was in October History of diabetes, hypertension, hyperlipidemia, fatty liver, overweight, asthma and allergies At our last visit she was not taking her metoprolol, had her start back on Metformin only for diabetes. She is taking 2 tablets / 1000 mg ONCE a day  She notes her sugars were going up a bit- we will check her A1c today She has declined statin use for primary prevention  Her home BP 129- 140/70-80 She has started walking with some friends in her neighborhood She also plans to join the [employer] Value Date   HGBA1C 6.9 (H) 10/15/2021   Shingles vaccination COVID-19 booster Foot Christensen is due- update today  Eye Christensen- this is UTD  Lab work done in October, CMP, lipid, vitamin D, CBC, A1c, TSH  She notes she is feeling more tired recently -she is sleeping about 7 hours per night, may take a 1 to 2-hour nap in the afternoon in addition.  She does admit that she snores.  She generally feels refreshed when she first wakes up in the morning  Patient Active Problem List    Diagnosis Date Noted   Neuropathy associated with endocrine disorder ([place]) 10/15/2021   Vitamin D deficiency 10/15/2020   Essential hypertension 07/25/2018   Diabetes mellitus type 2, controlled, without complications ([place]) 62/56/3893   Epistaxis, recurrent 07/18/2013   DJD (degenerative joint disease) 10/20/2011   HYPERCHOLESTEROLEMIA 05/02/2008   OVERWEIGHT 05/02/2008   FATTY LIVER DISEASE 05/02/2008   CHOLELITHIASIS 05/02/2008   ASTHMA 05/01/2008   BACK PAIN, LUMBAR 05/01/2008   ALLERGY 05/01/2008    Past Medical History:  Diagnosis Date   Acute bronchitis    Allergy, unspecified not elsewhere classified    Arthritis    Asthma    Bleeding nose    Cholelithiasis    Depression    Diabetes mellitus type 2, controlled, without complications ([place]) 7/34/2876   Fatty liver    Hypercholesterolemia    Hypertension    Lumbar back pain    Overweight(278.02)     Past Surgical History:  Procedure Laterality Date   INCISIONAL HERNIA REPAIR  2001   VESICOVAGINAL FISTULA CLOSURE W/ TAH  2000    Social History   Tobacco Use   Smoking status: Never   Smokeless tobacco: Never  Substance Use Topics   Alcohol use: No   Drug use: No    Family History  Problem Relation Age of Onset   Asthma Other     Allergies  Allergen Reactions   Penicillins  Swelling, rash, hives all over    Medication list has been reviewed and updated.  Current Outpatient Medications on File Prior to Visit  Medication Sig Dispense Refill   amLODipine (NORVASC) 5 MG tablet TAKE 1 TABLET BY MOUTH  DAILY 90 tablet 0   Blood Glucose Monitoring Suppl (ONETOUCH VERIO FLEX SYSTEM) w/Device KIT Use to check glucose as needed 1 kit prn   EPINEPHrine (EPIPEN 2-PAK) 0.3 mg/0.3 mL IJ SOAJ injection Inject 0.3 mg into the muscle as needed for anaphylaxis. For bee sting 1 each PRN   Insulin Pen Needle (PEN NEEDLES) 31G X 5 MM MISC Use as needed to give injection 100 each 5   losartan-hydrochlorothiazide  (HYZAAR) 100-25 MG tablet TAKE 1 TABLET BY MOUTH  DAILY 90 tablet 3   metFORMIN (GLUCOPHAGE) 500 MG tablet TAKE 2 TABLETS BY MOUTH TWICE  DAILY WITH MEALS 360 tablet 1   metoprolol succinate (TOPROL-XL) 25 MG 24 hr tablet TAKE 1 TABLET BY MOUTH  DAILY WITH OR IMMEDIATELY  FOLLOWING A MEAL 100 tablet 0   naproxen (NAPROSYN) 500 MG tablet TAKE 1 TABLET BY MOUTH  TWICE DAILY WITH MEALS AS  NEEDED FOR HIP PAIN 180 tablet 1   potassium chloride (MICRO-K) 10 MEQ CR capsule Take 2 capsules (20 mEq total) by mouth daily. 180 capsule 3   No current facility-administered medications on file prior to visit.    Review of Systems:  As per HPI- otherwise negative. No CP or SOB No PMB  Physical Examination: Vitals:   06/10/22 1043 06/10/22 1103  BP: (!) 158/80 (!) 142/80  Pulse: 86   Resp: 18   Temp: 98.3 F (36.8 C)   SpO2: 98%    Vitals:   06/10/22 1043  Weight: 184 lb 6.4 oz (83.6 kg)  Height: 5' 2"  (1.575 m)   Body mass index is 33.73 kg/m. Ideal Body Weight: Weight in (lb) to have BMI = 25: 136.4  GEN: no acute distress. Mild obesity, looks well HEENT: Atraumatic, Normocephalic. Bilateral TM wnl, oropharynx normal.  PEERL,EOMI.   Ears and Nose: No external deformity. CV: RRR, No M/G/R. No JVD. No thrill. No extra heart sounds. PULM: CTA B, no wheezes, crackles, rhonchi. No retractions. No resp. distress. No accessory muscle use. ABD: S, NT, ND, +BS. No rebound. No HSM. EXTR: No c/c/e PSYCH: Normally interactive. Conversant.  Foot Christensen normal Patient has a few superficial subcutaneous cysts on her right forearm-small, less than 0.5 cm in diameter, freely mobile and nontender Assessment and Plan: Controlled type 2 diabetes mellitus with complication, without long-term current use of insulin ([place]) - Plan: Basic metabolic panel, Hemoglobin A1c  Essential hypertension - Plan: CBC  Hyperlipidemia associated with type 2 diabetes mellitus ([facility])  Chronic fatigue - Plan: TSH  Patient  seen today for follow-up.  She was concerned that her blood sugars were going up, but recently has noted improvement.  A1c is pending Blood pressure under good control-she notes very good blood pressure numbers when she checks at home Discussed her fatigue, advised that she may have sleep apnea.  Offered to set up the sleep study.  For the time being she declines, await thyroid Will plan further follow- up pending labs.   Asked her to follow-up in 6 months  Signed Lamar Blinks, MD Received labs 6/15, message to patient  Results for orders placed or performed in visit on 26/20/35  Basic metabolic panel  Result Value Ref Range   Sodium 140 135 - 145 mEq/L  Potassium 3.7 3.5 - 5.1 mEq/L   Chloride 101 96 - 112 mEq/L   CO2 28 19 - 32 mEq/L   Glucose, Bld 111 (H) 70 - 99 mg/dL   BUN 18 6 - 23 mg/dL   Creatinine, Ser 0.71 0.40 - 1.20 mg/dL   GFR 86.26 >60.00 mL/min   Calcium 10.0 8.4 - 10.5 mg/dL  Hemoglobin A1c  Result Value Ref Range   Hgb A1c MFr Bld 6.9 (H) 4.6 - 6.5 %  CBC  Result Value Ref Range   WBC 10.6 (H) 4.0 - 10.5 K/uL   RBC 4.72 3.87 - 5.11 Mil/uL   Platelets 274.0 150.0 - 400.0 K/uL   Hemoglobin 13.5 12.0 - 15.0 g/dL   HCT 40.3 36.0 - 46.0 %   MCV 85.4 78.0 - 100.0 fl   MCHC 33.6 30.0 - 36.0 g/dL   RDW 14.1 11.5 - 15.5 %  TSH  Result Value Ref Range   TSH 2.47 0.35 - 5.50 uIU/mL

## 2022-06-07 NOTE — Patient Instructions (Incomplete)
Good to see you today, I will be in touch with your labs.  Assuming all is well please see me in 6 months Let me know if you would like to do a sleep study to check for sleep apnea  I would encourage you to do your shingles series and covid booster if not done already

## 2022-06-10 ENCOUNTER — Ambulatory Visit (INDEPENDENT_AMBULATORY_CARE_PROVIDER_SITE_OTHER): Payer: Medicare Other | Admitting: Family Medicine

## 2022-06-10 VITALS — BP 142/80 | HR 86 | Temp 98.3°F | Resp 18 | Ht 62.0 in | Wt 184.4 lb

## 2022-06-10 DIAGNOSIS — E118 Type 2 diabetes mellitus with unspecified complications: Secondary | ICD-10-CM

## 2022-06-10 DIAGNOSIS — R5382 Chronic fatigue, unspecified: Secondary | ICD-10-CM | POA: Diagnosis not present

## 2022-06-10 DIAGNOSIS — I10 Essential (primary) hypertension: Secondary | ICD-10-CM

## 2022-06-10 DIAGNOSIS — E785 Hyperlipidemia, unspecified: Secondary | ICD-10-CM | POA: Diagnosis not present

## 2022-06-10 DIAGNOSIS — E1169 Type 2 diabetes mellitus with other specified complication: Secondary | ICD-10-CM

## 2022-06-10 LAB — HEMOGLOBIN A1C: Hgb A1c MFr Bld: 6.9 % — ABNORMAL HIGH (ref 4.6–6.5)

## 2022-06-10 LAB — BASIC METABOLIC PANEL
BUN: 18 mg/dL (ref 6–23)
CO2: 28 mEq/L (ref 19–32)
Calcium: 10 mg/dL (ref 8.4–10.5)
Chloride: 101 mEq/L (ref 96–112)
Creatinine, Ser: 0.71 mg/dL (ref 0.40–1.20)
GFR: 86.26 mL/min (ref >60.00)
Glucose, Bld: 111 mg/dL — ABNORMAL HIGH (ref 70–99)
Potassium: 3.7 mEq/L (ref 3.5–5.1)
Sodium: 140 mEq/L (ref 135–145)

## 2022-06-10 LAB — CBC
HCT: 40.3 % (ref 36.0–46.0)
Hemoglobin: 13.5 g/dL (ref 12.0–15.0)
MCHC: 33.6 g/dL (ref 30.0–36.0)
MCV: 85.4 fl (ref 78.0–100.0)
Platelets: 274 10*3/uL (ref 150.0–400.0)
RBC: 4.72 Mil/uL (ref 3.87–5.11)
RDW: 14.1 % (ref 11.5–15.5)
WBC: 10.6 10*3/uL — ABNORMAL HIGH (ref 4.0–10.5)

## 2022-06-10 LAB — TSH: TSH: 2.47 u[IU]/mL (ref 0.35–5.50)

## 2022-06-11 ENCOUNTER — Encounter: Payer: Self-pay | Admitting: Family Medicine

## 2022-06-11 DIAGNOSIS — I10 Essential (primary) hypertension: Secondary | ICD-10-CM

## 2022-06-11 MED ORDER — AMLODIPINE BESYLATE 5 MG PO TABS: 5.0000 mg | ORAL_TABLET | Freq: Every day | ORAL | 3 refills | Status: DC

## 2022-06-11 MED ORDER — LOSARTAN POTASSIUM-HCTZ 100-25 MG PO TABS: 1.0000 | ORAL_TABLET | Freq: Every day | ORAL | 3 refills | Status: DC

## 2022-06-11 MED ORDER — METOPROLOL SUCCINATE ER 25 MG PO TB24: ORAL_TABLET | ORAL | 3 refills | Status: DC

## 2022-07-14 ENCOUNTER — Other Ambulatory Visit: Payer: Self-pay | Admitting: Family Medicine

## 2022-08-21 ENCOUNTER — Other Ambulatory Visit: Payer: Self-pay | Admitting: Family Medicine

## 2022-08-21 DIAGNOSIS — I10 Essential (primary) hypertension: Secondary | ICD-10-CM

## 2022-09-15 ENCOUNTER — Ambulatory Visit: Payer: Medicare Other | Admitting: Family

## 2022-10-17 NOTE — Patient Instructions (Incomplete)
Good to see you again today- I will be in touch with your labs asap! We can think about a coronary calcium CT depending on your lipid numbers  Recommend covid booster and dose of RSV this fall Also consider getting the shingles vaccine at your pharmacy   Assuming all is well please see me in about 6 months  Flu shot today

## 2022-10-17 NOTE — Progress Notes (Unsigned)
[place] at [facility] [address], [place], [place] [address] [phone]  Date:  10/21/2022   Name:  Anne Christensen   DOB:  04/17/1951   MRN:  [id number]  PCP:  Anne Mclean, MD    Chief Complaint: No chief complaint on file.   History of Present Illness:  Anne Christensen is a 71 y.o. very pleasant female patient who presents with the following:  Pt seen today for a follow-up visit  Last visit with myself in June  She lost her husband about a year ago- they were married for 40 years.  At our last visit she was seeing someone new  From June: She is not feeling depressed  Most recent visit with myself was in October History of diabetes, hypertension, hyperlipidemia, fatty liver, overweight, asthma and allergies At our last visit she was not taking her metoprolol, had her start back on Metformin only for diabetes. She is taking 2 tablets / 1000 mg ONCE a day  She notes her sugars were going up a bit- we will check her A1c today She has declined statin use for primary prevention Her home BP 129- 140/70-80 She has started walking with some friends in her neighborhood She also plans to join the Y  Amlodipine Losartan hctz Metformin Potassium  Toprol xl   Most recent labs- BMP in June only   Flu shot Covid booster shingrix Lab Results  Component Value Date   HGBA1C 6.9 (H) 06/10/2022   Lab Results  Component Value Date   TSH 2.47 06/10/2022    Patient Active Problem List   Diagnosis Date Noted   Neuropathy associated with endocrine disorder ([place]) 10/15/2021   Vitamin D deficiency 10/15/2020   Essential hypertension 07/25/2018   Diabetes mellitus type 2, controlled, without complications ([place]) 00/17/4944   Epistaxis, recurrent 07/18/2013   DJD (degenerative joint disease) 10/20/2011   HYPERCHOLESTEROLEMIA 05/02/2008   OVERWEIGHT 05/02/2008   FATTY LIVER DISEASE 05/02/2008   CHOLELITHIASIS 05/02/2008    ASTHMA 05/01/2008   BACK PAIN, LUMBAR 05/01/2008   ALLERGY 05/01/2008    Past Medical History:  Diagnosis Date   Acute bronchitis    Allergy, unspecified not elsewhere classified    Arthritis    Asthma    Bleeding nose    Cholelithiasis    Depression    Diabetes mellitus type 2, controlled, without complications ([place]) 9/67/5916   Fatty liver    Hypercholesterolemia    Hypertension    Lumbar back pain    Overweight(278.02)     Past Surgical History:  Procedure Laterality Date   INCISIONAL HERNIA REPAIR  2001   VESICOVAGINAL FISTULA CLOSURE W/ TAH  2000    Social History   Tobacco Use   Smoking status: Never   Smokeless tobacco: Never  Substance Use Topics   Alcohol use: No   Drug use: No    Family History  Problem Relation Age of Onset   Asthma Other     Allergies  Allergen Reactions   Penicillins     Swelling, rash, hives all over    Medication list has been reviewed and updated.  Current Outpatient Medications on File Prior to Visit  Medication Sig Dispense Refill   amLODipine (NORVASC) 5 MG tablet Take 1 tablet (5 mg total) by mouth daily. 90 tablet 3   Blood Glucose Monitoring Suppl ([place]) w/Device KIT Use to check glucose as needed 1 kit prn  EPINEPHrine (EPIPEN 2-PAK) 0.3 mg/0.3 mL IJ SOAJ injection Inject 0.3 mg into the muscle as needed for anaphylaxis. For bee sting 1 each PRN   Insulin Pen Needle (PEN NEEDLES) 31G X 5 MM MISC Use as needed to give injection 100 each 5   losartan-hydrochlorothiazide (HYZAAR) 100-25 MG tablet Take 1 tablet by mouth daily. 90 tablet 3   metFORMIN (GLUCOPHAGE) 500 MG tablet TAKE 2 TABLETS BY MOUTH TWICE  DAILY WITH MEALS 400 tablet 2   metoprolol succinate (TOPROL-XL) 25 MG 24 hr tablet TAKE 1 TABLET BY MOUTH  DAILY WITH OR IMMEDIATELY  FOLLOWING A MEAL 90 tablet 3   naproxen (NAPROSYN) 500 MG tablet TAKE 1 TABLET BY MOUTH  TWICE DAILY WITH MEALS AS  NEEDED FOR HIP PAIN 180 tablet 1   potassium  chloride (MICRO-K) 10 MEQ CR capsule TAKE 2 CAPSULES BY MOUTH  DAILY 200 capsule 2   No current facility-administered medications on file prior to visit.    Review of Systems:  As per HPI- otherwise negative.   Physical Examination: There were no vitals filed for this visit. There were no vitals filed for this visit. There is no height or weight on file to calculate BMI. Ideal Body Weight:    GEN: no acute distress. HEENT: Atraumatic, Normocephalic.  Ears and Nose: No external deformity. CV: RRR, No M/G/R. No JVD. No thrill. No extra heart sounds. PULM: CTA B, no wheezes, crackles, rhonchi. No retractions. No resp. distress. No accessory muscle use. ABD: S, NT, ND, +BS. No rebound. No HSM. EXTR: No c/c/e PSYCH: Normally interactive. Conversant.    Assessment and Plan: ***  Signed Lamar Blinks, MD

## 2022-10-21 ENCOUNTER — Ambulatory Visit (INDEPENDENT_AMBULATORY_CARE_PROVIDER_SITE_OTHER): Payer: Medicare Other | Admitting: Family Medicine

## 2022-10-21 ENCOUNTER — Encounter: Payer: Self-pay | Admitting: Family Medicine

## 2022-10-21 VITALS — BP 132/80 | HR 83 | Temp 98.3°F | Resp 18 | Ht 62.0 in | Wt 184.2 lb

## 2022-10-21 DIAGNOSIS — Z23 Encounter for immunization: Secondary | ICD-10-CM

## 2022-10-21 DIAGNOSIS — E1169 Type 2 diabetes mellitus with other specified complication: Secondary | ICD-10-CM | POA: Diagnosis not present

## 2022-10-21 DIAGNOSIS — I10 Essential (primary) hypertension: Secondary | ICD-10-CM

## 2022-10-21 DIAGNOSIS — E785 Hyperlipidemia, unspecified: Secondary | ICD-10-CM

## 2022-10-21 DIAGNOSIS — E118 Type 2 diabetes mellitus with unspecified complications: Secondary | ICD-10-CM

## 2022-10-21 LAB — LIPID PANEL
Cholesterol: 203 mg/dL — ABNORMAL HIGH (ref 0–200)
HDL: 58.1 mg/dL (ref >39.00)
LDL Cholesterol: 114 mg/dL — ABNORMAL HIGH (ref 0–99)
NonHDL: 145.21
Total CHOL/HDL Ratio: 3
Triglycerides: 157 mg/dL — ABNORMAL HIGH (ref 0.0–149.0)
VLDL: 31.4 mg/dL (ref 0.0–40.0)

## 2022-10-21 LAB — CBC
HCT: 40.7 % (ref 36.0–46.0)
Hemoglobin: 13.4 g/dL (ref 12.0–15.0)
MCHC: 32.9 g/dL (ref 30.0–36.0)
MCV: 86.1 fl (ref 78.0–100.0)
Platelets: 230 10*3/uL (ref 150.0–400.0)
RBC: 4.72 Mil/uL (ref 3.87–5.11)
RDW: 14.9 % (ref 11.5–15.5)
WBC: 10 10*3/uL (ref 4.0–10.5)

## 2022-10-21 LAB — COMPREHENSIVE METABOLIC PANEL
ALT: 18 U/L (ref 0–35)
AST: 17 U/L (ref 0–37)
Albumin: 4.1 g/dL (ref 3.5–5.2)
Alkaline Phosphatase: 43 U/L (ref 39–117)
BUN: 18 mg/dL (ref 6–23)
CO2: 29 mEq/L (ref 19–32)
Calcium: 9.5 mg/dL (ref 8.4–10.5)
Chloride: 101 mEq/L (ref 96–112)
Creatinine, Ser: 0.66 mg/dL (ref 0.40–1.20)
GFR: 88.76 mL/min (ref >60.00)
Glucose, Bld: 86 mg/dL (ref 70–99)
Potassium: 3.5 mEq/L (ref 3.5–5.1)
Sodium: 140 mEq/L (ref 135–145)
Total Bilirubin: 0.7 mg/dL (ref 0.2–1.2)
Total Protein: 7 g/dL (ref 6.0–8.3)

## 2022-10-21 LAB — MICROALBUMIN / CREATININE URINE RATIO
Creatinine,U: 61.6 mg/dL
Microalb Creat Ratio: 1.2 mg/g (ref 0.0–30.0)
Microalb, Ur: 0.8 mg/dL (ref 0.0–1.9)

## 2022-10-21 LAB — HEMOGLOBIN A1C: Hgb A1c MFr Bld: 6.7 % — ABNORMAL HIGH (ref 4.6–6.5)

## 2022-10-21 LAB — TSH: TSH: 2.4 u[IU]/mL (ref 0.35–5.50)

## 2022-10-29 ENCOUNTER — Encounter: Payer: Self-pay | Admitting: Family Medicine

## 2022-12-02 DIAGNOSIS — H43811 Vitreous degeneration, right eye: Secondary | ICD-10-CM | POA: Diagnosis not present

## 2022-12-02 LAB — HM DIABETES EYE EXAM

## 2022-12-10 ENCOUNTER — Ambulatory Visit: Payer: Medicare Other | Admitting: Family Medicine

## 2023-01-11 DIAGNOSIS — E119 Type 2 diabetes mellitus without complications: Secondary | ICD-10-CM | POA: Diagnosis not present

## 2023-01-29 NOTE — Progress Notes (Addendum)
[facility] at [facility] [address], [address] [place], [place] [address] [phone]  Date:  02/03/2023   Name:  Anne Christensen   DOB:  [birth year]   MRN:  [id number]  PCP:  Pearline Cables, MD    Chief Complaint: Generalized Body Aches (Onset 6-8 weeks naproxen and tylenol doesn't help much /Starting hurting in left thigh throbbing pain /Mostly upper arm and thighs to knees /Wakes up in the night with pain )   History of Present Illness:  Anne Christensen is a 72 y.o. very pleasant female patient who presents with the following:  Patient seen today with concern of muscle pain, no apparent cause  Most recent visit with myself was in October History of diabetes, hypertension, hyperlipidemia, fatty liver, overweight, asthma and allergies   Her lab work from last visit looked overall reassuring-I did recommend starting on a statin for primary prevention or obtaining a CT coronary calcium-we have done neither so far  She has noted muscle pains in her bilateral thighs down to her knees, and her upper arms and shoulders for about 2 months, getting worse She is having a hard time walking, the pain can wake her up at night The pain is present daily Worse at night She has noted some chills but no fever -she does not otherwise feel ill She is not taking any cholesterol meds/no statin drugs No new meds or change in her routine OTC meds help some -she has tried ibuprofen and Tylenol  She has not had this before  [facility] but has not [facility] in [facility] weeks and her sx are not better with pausing her exercise routine  She did pull a tick off her head when in [place] back in August    Lab Results  Component Value Date   HGBA1C 6.7 (H) 10/21/2022    Patient Active Problem List   Diagnosis Date Noted   Neuropathy associated with endocrine disorder ([facility]) 10/15/2021   Vitamin D deficiency 10/15/2020   Essential hypertension 07/25/2018    Diabetes mellitus type 2, controlled, without complications ([facility]) 03/16/2016   Epistaxis, recurrent 07/18/2013   DJD (degenerative joint disease) 10/20/2011   HYPERCHOLESTEROLEMIA 05/02/2008   OVERWEIGHT 05/02/2008   FATTY LIVER DISEASE 05/02/2008   CHOLELITHIASIS 05/02/2008   ASTHMA 05/01/2008   BACK PAIN, LUMBAR 05/01/2008   ALLERGY 05/01/2008    Past Medical History:  Diagnosis Date   Acute bronchitis    Allergy, unspecified not elsewhere classified    Arthritis    Asthma    Bleeding nose    Cholelithiasis    Depression    Diabetes mellitus type 2, controlled, without complications ([facility]) 03/16/2016   Fatty liver    Hypercholesterolemia    Hypertension    Lumbar back pain    Overweight(278.02)     Past Surgical History:  Procedure Laterality Date   INCISIONAL HERNIA REPAIR  2001   VESICOVAGINAL FISTULA CLOSURE W/ TAH  2000    Social History   Tobacco Use   Smoking status: Never   Smokeless tobacco: Never  Substance Use Topics   Alcohol use: No   Drug use: No    Family History  Problem Relation Age of Onset   Asthma Other     Allergies  Allergen Reactions   Penicillins     Swelling, rash, hives all over    Medication list has been reviewed and updated.  Current Outpatient Medications on File  Prior to Visit  Medication Sig Dispense Refill   amLODipine (NORVASC) 5 MG tablet Take 1 tablet (5 mg total) by mouth daily. 90 tablet 3   Blood Glucose Monitoring Suppl (ONETOUCH VERIO FLEX SYSTEM) w/Device KIT Use to check glucose as needed 1 kit prn   EPINEPHrine (EPIPEN 2-PAK) 0.3 mg/0.3 mL IJ SOAJ injection Inject 0.3 mg into the muscle as needed for anaphylaxis. For bee sting 1 each PRN   Insulin Pen Needle (PEN NEEDLES) 31G X 5 MM MISC Use as needed to give injection 100 each 5   losartan-hydrochlorothiazide (HYZAAR) 100-25 MG tablet Take 1 tablet by mouth daily. 90 tablet 3   metFORMIN (GLUCOPHAGE) 500 MG tablet TAKE 2 TABLETS BY MOUTH TWICE  DAILY WITH  MEALS 400 tablet 2   metoprolol succinate (TOPROL-XL) 25 MG 24 hr tablet TAKE 1 TABLET BY MOUTH  DAILY WITH OR IMMEDIATELY  FOLLOWING A MEAL 90 tablet 3   naproxen (NAPROSYN) 500 MG tablet TAKE 1 TABLET BY MOUTH  TWICE DAILY WITH MEALS AS  NEEDED FOR HIP PAIN 180 tablet 1   potassium chloride (MICRO-K) 10 MEQ CR capsule TAKE 2 CAPSULES BY MOUTH  DAILY 200 capsule 2   No current facility-administered medications on file prior to visit.    Review of Systems:  As per HPI- otherwise negative.   Physical Examination: Vitals:   02/03/23 1014  BP: (!) 144/92  Pulse: 91  Resp: 18  Temp: 98.8 F (37.1 C)  SpO2: 98%   Vitals:   02/03/23 1014  Weight: 184 lb 12.8 oz (83.8 kg)  Height: 5\' 2"  (1.575 m)   Body mass index is 33.8 kg/m. Ideal Body Weight: Weight in (lb) to have BMI = 25: 136.4  GEN: no acute distress.  Obese, looks well HEENT: Atraumatic, Normocephalic. Bilateral TM wnl, oropharynx normal.  PEERL,EOMI.   Ears and Nose: No external deformity. CV: RRR, No M/G/R. No JVD. No thrill. No extra heart sounds. PULM: CTA B, no wheezes, crackles, rhonchi. No retractions. No resp. distress. No accessory muscle use. ABD: S, NT, ND, EXTR: No c/c/e PSYCH: Normally interactive. Conversant.  Patient endorses tenderness palpation in her bilateral deltoids/biceps/triceps and her bilateral quadriceps.  Strength seems normal.  Normal reflexes No swelling or redness  Assessment and Plan: Body aches - Plan: CK (Creatine Kinase), VITAMIN D 25 Hydroxy (Vit-D Deficiency, Fractures), TSH, Sedimentation rate, C-reactive protein, Antinuclear Antib (ANA), Rheumatoid Factor, B. burgdorfi antibodies  Controlled type 2 diabetes mellitus with complication, without long-term current use of insulin ([facility]) - Plan: Hemoglobin A1c  Essential hypertension - Plan: CBC, Comprehensive metabolic panel  Patient seen today with diffuse muscle aches, mostly in the proximal arms and legs-present for 2 months,  getting worse.  She did pull off a tick while in [place] over the summer Will obtain lab work as above in an attempt to find a cause for her symptoms Will also follow-up on her diabetes control with A1c  Blood pressure is minimally elevated, typically it is under good control.  Continue current dose of medication  Signed Abbe Amsterdam, MD Received labs as below, few things are still pending.  Message to patient Results for orders placed or performed in visit on 02/03/23  CBC  Result Value Ref Range   WBC 10.6 (H) 4.0 - 10.5 K/uL   RBC 5.10 3.87 - 5.11 Mil/uL   Platelets 289.0 150.0 - 400.0 K/uL   Hemoglobin 14.6 12.0 - 15.0 g/dL   HCT 52.8 41.3 - 24.4 %  MCV 85.0 78.0 - 100.0 fl   MCHC 33.7 30.0 - 36.0 g/dL   RDW 16.1 09.6 - 04.5 %  Comprehensive metabolic panel  Result Value Ref Range   Sodium 142 135 - 145 mEq/L   Potassium 3.5 3.5 - 5.1 mEq/L   Chloride 101 96 - 112 mEq/L   CO2 29 19 - 32 mEq/L   Glucose, Bld 147 (H) 70 - 99 mg/dL   BUN 14 6 - 23 mg/dL   Creatinine, Ser 4.09 0.40 - 1.20 mg/dL   Total Bilirubin 0.5 0.2 - 1.2 mg/dL   Alkaline Phosphatase 53 39 - 117 U/L   AST 15 0 - 37 U/L   ALT 17 0 - 35 U/L   Total Protein 7.9 6.0 - 8.3 g/dL   Albumin 4.7 3.5 - 5.2 g/dL   GFR 81.19 >14.78 mL/min   Calcium 10.0 8.4 - 10.5 mg/dL  CK (Creatine Kinase)  Result Value Ref Range   Total CK 72 7 - 177 U/L  VITAMIN D 25 Hydroxy (Vit-D Deficiency, Fractures)  Result Value Ref Range   VITD 23.45 (L) 30.00 - 100.00 ng/mL  TSH  Result Value Ref Range   TSH 1.95 0.35 - 5.50 uIU/mL  Sedimentation rate  Result Value Ref Range   Sed Rate 41 (H) 0 - 30 mm/hr  C-reactive protein  Result Value Ref Range   CRP <1.0 0.5 - 20.0 mg/dL  Hemoglobin G9F  Result Value Ref Range   Hgb A1c MFr Bld 6.8 (H) 4.6 - 6.5 %

## 2023-01-29 NOTE — Patient Instructions (Incomplete)
Good to see again today Recommend the shingles vaccine series, COVID booster at your pharmacy if not done already  I will be in touch with your labs asap Let me know if anything else comes up or changes

## 2023-02-03 ENCOUNTER — Ambulatory Visit (INDEPENDENT_AMBULATORY_CARE_PROVIDER_SITE_OTHER): Payer: Medicare Other | Admitting: Family Medicine

## 2023-02-03 ENCOUNTER — Encounter: Payer: Self-pay | Admitting: Family Medicine

## 2023-02-03 VITALS — BP 144/92 | HR 91 | Temp 98.8°F | Resp 18 | Ht 62.0 in | Wt 184.8 lb

## 2023-02-03 DIAGNOSIS — E118 Type 2 diabetes mellitus with unspecified complications: Secondary | ICD-10-CM | POA: Diagnosis not present

## 2023-02-03 DIAGNOSIS — R52 Pain, unspecified: Secondary | ICD-10-CM

## 2023-02-03 DIAGNOSIS — I10 Essential (primary) hypertension: Secondary | ICD-10-CM

## 2023-02-03 LAB — C-REACTIVE PROTEIN: CRP: <1 mg/dL (ref 0.5–20.0)

## 2023-02-03 LAB — COMPREHENSIVE METABOLIC PANEL
ALT: 17 U/L (ref 0–35)
AST: 15 U/L (ref 0–37)
Albumin: 4.7 g/dL (ref 3.5–5.2)
Alkaline Phosphatase: 53 U/L (ref 39–117)
BUN: 14 mg/dL (ref 6–23)
CO2: 29 mEq/L (ref 19–32)
Calcium: 10 mg/dL (ref 8.4–10.5)
Chloride: 101 mEq/L (ref 96–112)
Creatinine, Ser: 0.74 mg/dL (ref 0.40–1.20)
GFR: 81.7 mL/min (ref >60.00)
Glucose, Bld: 147 mg/dL — ABNORMAL HIGH (ref 70–99)
Potassium: 3.5 mEq/L (ref 3.5–5.1)
Sodium: 142 mEq/L (ref 135–145)
Total Bilirubin: 0.5 mg/dL (ref 0.2–1.2)
Total Protein: 7.9 g/dL (ref 6.0–8.3)

## 2023-02-03 LAB — SEDIMENTATION RATE: Sed Rate: 41 mm/hr — ABNORMAL HIGH (ref 0–30)

## 2023-02-03 LAB — HEMOGLOBIN A1C: Hgb A1c MFr Bld: 6.8 % — ABNORMAL HIGH (ref 4.6–6.5)

## 2023-02-03 LAB — CBC
HCT: 43.4 % (ref 36.0–46.0)
Hemoglobin: 14.6 g/dL (ref 12.0–15.0)
MCHC: 33.7 g/dL (ref 30.0–36.0)
MCV: 85 fl (ref 78.0–100.0)
Platelets: 289 10*3/uL (ref 150.0–400.0)
RBC: 5.1 Mil/uL (ref 3.87–5.11)
RDW: 14.2 % (ref 11.5–15.5)
WBC: 10.6 10*3/uL — ABNORMAL HIGH (ref 4.0–10.5)

## 2023-02-03 LAB — TSH: TSH: 1.95 u[IU]/mL (ref 0.35–5.50)

## 2023-02-03 LAB — CK: Total CK: 72 U/L (ref 7–177)

## 2023-02-03 LAB — VITAMIN D 25 HYDROXY (VIT D DEFICIENCY, FRACTURES): VITD: 23.45 ng/mL — ABNORMAL LOW (ref 30.00–100.00)

## 2023-02-05 LAB — ANA: Anti Nuclear Antibody (ANA): POSITIVE — AB

## 2023-02-05 LAB — RHEUMATOID FACTOR: Rheumatoid fact SerPl-aCnc: <14 IU/mL (ref <14)

## 2023-02-05 LAB — B. BURGDORFI ANTIBODIES: B burgdorferi Ab IgG+IgM: <0.9 index

## 2023-02-05 LAB — ANTI-NUCLEAR AB-TITER (ANA TITER): ANA Titer 1: 1:320 {titer} — ABNORMAL HIGH

## 2023-02-07 ENCOUNTER — Encounter: Payer: Self-pay | Admitting: Family Medicine

## 2023-02-07 DIAGNOSIS — R768 Other specified abnormal immunological findings in serum: Secondary | ICD-10-CM

## 2023-02-08 MED ORDER — MELOXICAM 15 MG PO TABS: 15.0000 mg | ORAL_TABLET | Freq: Every day | ORAL | 1 refills | Status: DC

## 2023-02-08 NOTE — Addendum Note (Signed)
Addended by: Lamar Blinks C on: 02/08/2023 05:05 PM   Modules accepted: Orders

## 2023-02-21 ENCOUNTER — Other Ambulatory Visit: Payer: Self-pay | Admitting: Family Medicine

## 2023-02-21 DIAGNOSIS — I10 Essential (primary) hypertension: Secondary | ICD-10-CM

## 2023-02-23 MED ORDER — MELOXICAM 15 MG PO TABS: 15.0000 mg | ORAL_TABLET | Freq: Every day | ORAL | 1 refills | Status: DC

## 2023-02-23 NOTE — Addendum Note (Signed)
Addended by: Lamar Blinks C on: 02/23/2023 12:18 PM   Modules accepted: Orders

## 2023-03-19 ENCOUNTER — Other Ambulatory Visit: Payer: Self-pay | Admitting: Family Medicine

## 2023-03-19 DIAGNOSIS — I10 Essential (primary) hypertension: Secondary | ICD-10-CM

## 2023-03-25 DIAGNOSIS — M25511 Pain in right shoulder: Secondary | ICD-10-CM | POA: Diagnosis not present

## 2023-03-25 DIAGNOSIS — M4692 Unspecified inflammatory spondylopathy, cervical region: Secondary | ICD-10-CM | POA: Diagnosis not present

## 2023-03-25 DIAGNOSIS — M25512 Pain in left shoulder: Secondary | ICD-10-CM | POA: Diagnosis not present

## 2023-03-25 DIAGNOSIS — M5412 Radiculopathy, cervical region: Secondary | ICD-10-CM | POA: Diagnosis not present

## 2023-04-08 DIAGNOSIS — M25511 Pain in right shoulder: Secondary | ICD-10-CM | POA: Diagnosis not present

## 2023-04-08 DIAGNOSIS — R768 Other specified abnormal immunological findings in serum: Secondary | ICD-10-CM | POA: Diagnosis not present

## 2023-04-08 DIAGNOSIS — G8929 Other chronic pain: Secondary | ICD-10-CM | POA: Diagnosis not present

## 2023-04-09 ENCOUNTER — Other Ambulatory Visit: Payer: Self-pay | Admitting: Family Medicine

## 2023-04-09 DIAGNOSIS — I10 Essential (primary) hypertension: Secondary | ICD-10-CM

## 2023-04-10 DIAGNOSIS — M25511 Pain in right shoulder: Secondary | ICD-10-CM | POA: Diagnosis not present

## 2023-04-10 DIAGNOSIS — G8929 Other chronic pain: Secondary | ICD-10-CM | POA: Diagnosis not present

## 2023-04-20 ENCOUNTER — Other Ambulatory Visit: Payer: Self-pay | Admitting: Family Medicine

## 2023-04-29 ENCOUNTER — Telehealth: Payer: Self-pay | Admitting: *Deleted

## 2023-04-29 NOTE — Telephone Encounter (Signed)
[employer] ins calling stating that patient should be on a statin therapy because she is a diabetic and they show her not being on anything.  They want to make sure we address this.  If we have any questions we can call [phone]

## 2023-05-09 ENCOUNTER — Other Ambulatory Visit: Payer: Self-pay | Admitting: Family Medicine

## 2023-05-09 DIAGNOSIS — R768 Other specified abnormal immunological findings in serum: Secondary | ICD-10-CM

## 2023-05-28 ENCOUNTER — Other Ambulatory Visit: Payer: Self-pay | Admitting: Family Medicine

## 2023-05-28 DIAGNOSIS — I10 Essential (primary) hypertension: Secondary | ICD-10-CM

## 2023-06-01 ENCOUNTER — Other Ambulatory Visit: Payer: Self-pay | Admitting: Family Medicine

## 2023-06-01 DIAGNOSIS — I10 Essential (primary) hypertension: Secondary | ICD-10-CM

## 2023-06-02 ENCOUNTER — Encounter: Payer: Medicare Other | Admitting: Internal Medicine

## 2023-06-15 ENCOUNTER — Other Ambulatory Visit: Payer: Self-pay | Admitting: Family Medicine

## 2023-06-15 DIAGNOSIS — I10 Essential (primary) hypertension: Secondary | ICD-10-CM
